# Patient Record
Sex: Female | Born: 1935 | Race: White | Hispanic: No | Marital: Married | Smoking: Never smoker
Health system: Southern US, Community
[De-identification: ages and names within clinical notes are randomized; demographics above are authoritative.]

## PROBLEM LIST (undated history)

## (undated) DIAGNOSIS — R911 Solitary pulmonary nodule: Secondary | ICD-10-CM

## (undated) DIAGNOSIS — R0602 Shortness of breath: Secondary | ICD-10-CM

## (undated) DIAGNOSIS — E785 Hyperlipidemia, unspecified: Secondary | ICD-10-CM

## (undated) DIAGNOSIS — I2510 Atherosclerotic heart disease of native coronary artery without angina pectoris: Secondary | ICD-10-CM

## (undated) DIAGNOSIS — C801 Malignant (primary) neoplasm, unspecified: Secondary | ICD-10-CM

## (undated) DIAGNOSIS — E039 Hypothyroidism, unspecified: Secondary | ICD-10-CM

## (undated) DIAGNOSIS — M1990 Unspecified osteoarthritis, unspecified site: Secondary | ICD-10-CM

## (undated) DIAGNOSIS — I10 Essential (primary) hypertension: Secondary | ICD-10-CM

## (undated) DIAGNOSIS — Z90710 Acquired absence of both cervix and uterus: Secondary | ICD-10-CM

## (undated) HISTORY — PX: TOTAL THYROIDECTOMY: SHX2547

## (undated) HISTORY — DX: Atherosclerotic heart disease of native coronary artery without angina pectoris: I25.10

## (undated) HISTORY — PX: EYE SURGERY: SHX253

## (undated) HISTORY — DX: Hyperlipidemia, unspecified: E78.5

## (undated) HISTORY — PX: BLADDER SURGERY: SHX569

## (undated) HISTORY — DX: Solitary pulmonary nodule: R91.1

## (undated) HISTORY — PX: RETINAL DETACHMENT SURGERY: SHX105

## (undated) HISTORY — DX: Essential (primary) hypertension: I10

## (undated) HISTORY — DX: Acquired absence of both cervix and uterus: Z90.710

## (undated) HISTORY — DX: Unspecified osteoarthritis, unspecified site: M19.90

## (undated) HISTORY — PX: ABDOMINAL HYSTERECTOMY: SHX81

## (undated) HISTORY — DX: Malignant (primary) neoplasm, unspecified: C80.1

---

## 2013-11-22 DIAGNOSIS — R911 Solitary pulmonary nodule: Secondary | ICD-10-CM

## 2013-11-22 DIAGNOSIS — C3410 Malignant neoplasm of upper lobe, unspecified bronchus or lung: Secondary | ICD-10-CM | POA: Insufficient documentation

## 2013-11-22 HISTORY — DX: Solitary pulmonary nodule: R91.1

## 2014-06-18 LAB — PULMONARY FUNCTION TEST

## 2014-07-04 ENCOUNTER — Encounter: Payer: Medicare PPO | Admitting: Cardiothoracic Surgery

## 2014-07-05 ENCOUNTER — Institutional Professional Consult (permissible substitution) (INDEPENDENT_AMBULATORY_CARE_PROVIDER_SITE_OTHER): Payer: 59 | Admitting: Cardiothoracic Surgery

## 2014-07-05 ENCOUNTER — Encounter: Payer: Self-pay | Admitting: Cardiothoracic Surgery

## 2014-07-05 ENCOUNTER — Encounter: Payer: Self-pay | Admitting: *Deleted

## 2014-07-05 VITALS — BP 150/96 | HR 76 | Ht 67.0 in | Wt 180.0 lb

## 2014-07-05 DIAGNOSIS — M069 Rheumatoid arthritis, unspecified: Secondary | ICD-10-CM | POA: Insufficient documentation

## 2014-07-05 DIAGNOSIS — I2584 Coronary atherosclerosis due to calcified coronary lesion: Secondary | ICD-10-CM

## 2014-07-05 DIAGNOSIS — M1990 Unspecified osteoarthritis, unspecified site: Secondary | ICD-10-CM | POA: Insufficient documentation

## 2014-07-05 DIAGNOSIS — Z85850 Personal history of malignant neoplasm of thyroid: Secondary | ICD-10-CM

## 2014-07-05 DIAGNOSIS — R9389 Abnormal findings on diagnostic imaging of other specified body structures: Secondary | ICD-10-CM | POA: Insufficient documentation

## 2014-07-05 DIAGNOSIS — E785 Hyperlipidemia, unspecified: Secondary | ICD-10-CM | POA: Insufficient documentation

## 2014-07-05 DIAGNOSIS — I2510 Atherosclerotic heart disease of native coronary artery without angina pectoris: Secondary | ICD-10-CM | POA: Insufficient documentation

## 2014-07-05 DIAGNOSIS — R911 Solitary pulmonary nodule: Secondary | ICD-10-CM

## 2014-07-05 DIAGNOSIS — I10 Essential (primary) hypertension: Secondary | ICD-10-CM | POA: Insufficient documentation

## 2014-07-05 DIAGNOSIS — J984 Other disorders of lung: Secondary | ICD-10-CM

## 2014-07-05 DIAGNOSIS — E039 Hypothyroidism, unspecified: Secondary | ICD-10-CM | POA: Insufficient documentation

## 2014-07-05 NOTE — Patient Instructions (Signed)
Lung Resection A lung resection is a procedure to remove part or all of a lung. When an entire lung is removed, the procedure is called a pneumonectomy. When only part of a lung is removed, the procedure is called a lobectomy. A lung resection is typically done to get rid of a tumor or cancer, but it may be done to treat other conditions. This procedure can help relieve some or all of your symptoms and can also help keep the problem from getting worse. Lung resection may provide the best chance for curing your disease. However, the procedure may not necessarily cure lung cancer if that is the problem.  LET YOUR HEALTH CARE PROVIDER KNOW ABOUT:   Any allergies you have.  All medicines you are taking, including vitamins, herbs, eye drops, creams, and over-the-counter medicines.  Previous problems you or members of your family have had with the use of anesthetics.  Any blood disorders you have.  Previous surgeries you have had.  Medical conditions you have. RISKS AND COMPLICATIONS  Generally, lung resection is a safe procedure. However, problems can occur and include:  Excessive bleeding.  Infection.  Inability to breathe without a ventilator.  Persistent shortness of breath.  Heart problems, including abnormal rhythms and a risk of heart attack or heart failure.  Blood clots.  Injury to a blood vessel.  Injury to a nerve.  Failure to heal properly.  Stroke.  Bronchopleural fistula. This is a small hole between one of the main breathing tubes (bronchus) and the lining of the lungs. This is rare.  Reaction to anesthesia. BEFORE THE PROCEDURE  You may have tests done before the procedure, including:  Blood tests.  Urine tests.  X-rays.  Other imaging tests (such as CT scans, MRI scans, and PET scans). These tests are done to find the exact size and location of the condition being treated with this surgery.  Pulmonary function tests. These are breathing tests to assess  the function of your lungs before surgery and to decide how to best help your breathing after surgery.  Heart testing. This is done to make sure your heart is strong enough for the procedure.  Bronchoscopy. This is a technique that allows your health care provider to look at the inside of your airways. This is done using a soft, flexible tube (bronchoscope). Along with imaging tests, this can help your health care provider know the exact location and size of the area that will be removed during surgery.  Lymph node sampling. This may need to be done to see if the tumor has spread. It may be done as a separate surgery or right before your lung resection procedure. PROCEDURE  An IV tube will be placed in your arm. You will be given a medicine that makes you fall asleep (general anesthetic). You may also get pain medicine through a thin, flexible tube (catheter) in your back.  A breathing tube will be placed in your throat.  Once the surgical team has prepared you for surgery, your surgeon will make an incision on your side. Some resections are done through large incisions, while others can be done through small incisions using smaller instruments and assisted with small cameras (laparoscopic surgery).  Your surgeon will carefully cut the veins, arteries, and bronchus leading to your lung. After being cut, each of these pieces will be sewn or stapled closed. The lung or part of the lung will then be removed.  Your surgeon will check inside your chest to make   sure there is no bleeding in or around the lungs. Lymph nodes near the lung may also be removed for later tests.  Your surgeon may put tubes into your chest to drain extra fluid and air after surgery.  Your incision will be closed. This may be done using:  Stitches that absorb into your body and do not need to be removed.  Stitches that must be removed.  Staples that must be removed. AFTER THE PROCEDURE   You will be taken to the  recovery area and your progress will be monitored. You may still have a breathing tube and other tubes or catheters in your body immediately after surgery. These will be removed during your recovery. You may be put on a respirator following surgery if some assistance is needed to help your breathing. When you are awake and not experiencing immediate problems from surgery, you will be moved to the intensive care unit (ICU) where you will continue your recovery.  You may feel pain in your chest and throat. Sometimes during recovery, patients may shiver or feel nauseous. You will be given medicine to help with pain and nausea.  The breathing tube will be taken out as soon as your health care providers feel you can breathe on your own. For most people, this happens on the same day as the surgery.  If your surgery and time in the ICU go well, most of the tubes and equipment will be taken out within 1-2 days after surgery. This is about how long most people stay in the ICU. You may need to stay longer, depending on how you are doing.  You should also start respiratory therapy in the ICU. This therapy uses breathing exercises to help your other lung stay healthy and get stronger.  As you improve, you will be moved to a regular hospital room for continued respiratory therapy, help with your bladder and bowels, and to continue medicines.  After your lung or part of your lung is taken out, there will be a space inside your chest. This space will often fill up with fluid over time. The amount of time this takes is different for each person.  You will receive care until you are doing well and your health care provider feels it is safe for you to go home or to transfer to an extended care facility. Document Released: 01/30/2003 Document Revised: 03/26/2014 Document Reviewed: 12/29/2013 [facility] Patient Information 2015 [place], [place]. This information is not intended to replace advice given to you by your health  care provider. Make sure you discuss any questions you have with your health care provider. Lung Resection, Care After Refer to this sheet in the next few weeks. These instructions provide you with information on caring for yourself after your procedure. Your health care provider may also give you more specific instructions. Your treatment has been planned according to current medical practices, but problems sometimes occur. Call your health care provider if you have any problems or questions after your procedure. WHAT TO EXPECT AFTER THE PROCEDURE After your procedure, it is typical to have the following:   You may feel pain in your chest and throat.  Patients may sometimes shiver or feel nauseous during recovery. HOME CARE INSTRUCTIONS  You may resume a normal diet and activities as directed by your health care provider.  Do not use any tobacco products, including cigarettes, chewing tobacco, or electronic cigarettes. If you need help quitting, ask your health care provider.  There are many different  ways to close and cover an incision, including stitches, skin glue, and adhesive strips. Follow your health care provider's instructions on:  Incision care.  Bandage (dressing) changes and removal.  Incision closure removal.  Take medicines only as directed by your health care provider.  Keep all follow-up visits as directed by your health care provider. This is important.  Try to breathe deeply and cough as directed. Holding a pillow firmly over your ribs may help with discomfort.  If you were given an incentive spirometer in the hospital, continue to use it as directed by your health care provider.  Walk as directed by your health care provider.  You may take a shower and gently wash the area of your incision with water and soap as directed by your health care provider. Do not use anything else to clean your incision except as directed by your health care provider. Do not take baths,  swim, or use a hot tub until your health care provider approves. SEEK MEDICAL CARE IF:  You notice redness, swelling, or increasing pain at the incision site.  You are bleeding at the incision site.  You see pus coming from the incision site.  You notice a bad smell coming from the incision site or bandage.  Your incision breaks open.  You cough up blood or pus, or you develop a cough that produces bad-smelling sputum.  You have pain or swelling in your legs.  You have increasing pain that is not controlled with medicine.  You have trouble managing any of the tubes that have been left in place after surgery.  You have fever or chills. SEEK IMMEDIATE MEDICAL CARE IF:   You have chest pain or an irregular or rapid heartbeat.  You have dizzy episodes or faint.  You have shortness of breath or difficulty breathing.  You have persistent nausea or vomiting.  You have a rash. Document Released: 05/29/2005 Document Revised: 03/26/2014 Document Reviewed: 12/29/2013 [facility] Patient Information 2015 [place], [place]. This information is not intended to replace advice given to you by your health care provider. Make sure you discuss any questions you have with your health care provider. Lung Cancer Lung cancer is an abnormal growth of cells in one or both of your lungs. These extra cells may form a mass of tissue called a growth or tumor. Tumors can be either cancerous (malignant) or not cancerous (benign).  Lung cancer is the most common cause of cancer death in men and women. There are several different types of lung cancers. Usually, lung cancer is described as either small cell lung cancer or nonsmall cell lung cancer. Other types of cancer occur in the lungs, including carcinoid and cancers spread from other organs. The types of cancer have different behavior and treatment. RISK FACTORS Smoking is the most common risk factor for developing lung cancer. Other risk factors  include:  Radon gas exposure.  Asbestos and other industrial substance exposure.  Second hand tobacco smoke.  Air pollution.  Family or personal history of lung cancer.  Age older than 69 years. CAUSES  Lung cancer usually starts when the lungs are exposed to harmful chemicals. Smoking is the most common risk factor for lung cancer. When you quit smoking, your risk of lung cancer falls each year (but is never the same as a person who has never smoked).  SYMPTOMS  Lung cancer may not have any symptoms in its early stages. The symptoms can depend on the type of cancer, its location, and other  factors. Symptoms can include:  Cough (either new, different, or more severe).  Shortness of breath.  Coughing up blood (hemoptysis).  Chest pain.  Hoarseness.  Swelling of the face.  Drooping eyelid.  Changes in blood tests, such as low sodium (hyponatremia), high calcium (hypercalcemia), or low blood count (anemia).  Weight loss. DIAGNOSIS  Your health care provider may suspect lung cancer based on your symptoms or based on tests obtained for other reasons. Tests or procedures used to find or confirm the presence of lung cancer may include:  Chest X-ray.  CT scan of the lungs and chest.  Blood tests.  Taking a tissue sample (biopsy) from your lung to look for cancer cells. Your cancer will be staged to determine its severity and extent. Staging is a careful attempt to find out the size of the tumor, whether the cancer has spread, and if so, to what parts of the body. You may need to have more tests to determine the stage of your cancer. The test results will help determine what treatment plan is best for you.   Stage 0--This is the earliest stage of lung cancer. In this stage the tumor is present in only a few layers of cells and has not grown beyond the inner lining of the lungs. Stage 0 (carcinoma in situ) is considered noninvasive, meaning at this stage it is not yet capable of  spreading to other regions.  Stage I-- The cancer is located only in the lungs and not spread to any lymph nodes.  Stage II--The cancer is in the lungs and the nearby lymph nodes.  Stage III--The cancer is in the lungs and the lymph nodes in the middle of the chest. This is also called locally advanced disease. This stage has two subtypes:  Stage IIIa - The cancer has spread only to lymph nodes on the same side of the chest where the cancer started.  Stage IIIb - The cancer has spread to lymph nodes on the opposite side of the chest or above the collar bone.  Stage IV-- This is the most advanced stage of lung cancer and is also called advanced disease. This stage describes when the cancer has spread to both lungs, the fluid in the area around the lungs, or to another body part. Your health care provider may tell you the detailed stage of your cancer, which includes both a number and a letter.  TREATMENT  Depending on the type and stage, lung cancer may be treated with surgery, radiation therapy, chemotherapy, or targeted therapy. Some people have a combination of these therapies. Your treatment plan will be developed by your health care team.  [place]st not smoke.  Only take over-the-counter or prescription medicines for pain, discomfort, or fever as directed by your health care provider.  Maintain a healthy diet.  Consider joining a support group. This may help you learn to cope with the stress of having lung cancer.  Seek advice to help you manage treatment side effects.  Keep all follow-up appointments as directed by your health care provider.  Inform your cancer specialist if you are admitted to the hospital. [place] IF:   You are losing weight without trying.  You have a persistent cough.  You feel short of breath.  You tire easily. SEEK IMMEDIATE MEDICAL CARE IF:   You cough up clotted blood or bright red blood.  Your pain is not  manageable or controlled by medicine.  You develop  new difficulty breathing or chest pain.  You develop swelling in one or both ankles or legs, or swelling in your face or neck.  You develop headache or confusion. Document Released: 02/15/2001 Document Revised: 08/30/2013 Document Reviewed: 03/15/2014 [facility] Patient Information 2015 [place], [place]. This information is not intended to replace advice given to you by your health care provider. Make sure you discuss any questions you have with your health care provider.

## 2014-07-05 NOTE — Progress Notes (Signed)
[address]       [place] [address]             [phone]                    Nicole Ortiz Nelson Medical Record #[id number] Date of Birth: 11/14/1936  Referring: Gardiner Rhyme, MD Primary Care: Alma Friendly, MD  Chief Complaint:  Abnormal CT scan  History of Present Illness:    Nicole Ortiz 78 y.o. female is seen in the office  today at the request of Dr. Alcide Clever. Last year the patient had a thyroid border and cyst she had a thyroidectomy at that time. Subsequently she was treated with I-131 for thyroid cancer,Sclerosing papillary thyroid carcinoma, follicular variant, multifocal. CT scan in December 2014 suggested lung mass, followup CT scan and PET scan was done recently and the patient is referred for evaluation of lung mass, described as a groundglass opacity in the left lung apex 2.1 cm with an SUV of 4.0. The patient is a lifelong nonsmoker. She denies any respiratory difficulty denies hemoptysis.    Current Activity/ Functional Status:  Patient is independent with mobility/ambulation, transfers, ADL's, IADL's.   Zubrod Score: At the time of surgery this patient's most appropriate activity status/level should be described as: [x]     0    Normal activity, no symptoms []     1    Restricted in physical strenuous activity but ambulatory, able to do out light work []     2    Ambulatory and capable of self care, unable to do work activities, up and about               >50 % of waking hours                              []     3    Only limited self care, in bed greater than 50% of waking hours []     4    Completely disabled, no self care, confined to bed or chair []     5    Moribund   Past Medical History  Diagnosis Date  . Hypertension   . Hyperlipidemia   . Arthritis   . CAD (coronary artery disease)     with stent placement  . H/O: hysterectomy   . Lesion of left lung 11/22/13    left upper lobe  . Cancer     thyroid    Past Surgical  History  Procedure Laterality Date  . Total thyroidectomy    . Eye surgery    . Retinal detachment surgery    . Bladder surgery      Family History: Father died mi 3 brothers alive, married 2 children   History   Social History  . Marital Status: Married    Spouse Name: N/A    Number of Children: N/A  . Years of Education: N/A   Occupational History  . Worked as Art gallery manager   Social History Main Topics  . Smoking status: Never Smoker   . Smokeless tobacco: Not on file  . Alcohol Use: No  . Drug Use: Not on file  . Sexual Activity: Not on file      History  Smoking status  . Never Smoker   Smokeless tobacco  . Not on file    History  Alcohol Use No     Allergies  Allergen Reactions  . Chlorthalidone   . Lipitor [Atorvastatin]   . Zocor [Simvastatin]     Current Outpatient Prescriptions  Medication Sig Dispense Refill  . amLODipine (NORVASC) 5 MG tablet Take 5 mg by mouth daily.      Marland Kitchen aspirin EC 81 MG tablet Take 81 mg by mouth daily.      . calcium-vitamin D (OSCAL WITH D) 500-200 MG-UNIT per tablet Take 1 tablet by mouth daily with breakfast.      . colesevelam (WELCHOL) 625 MG tablet Take by mouth 4 (four) times daily.      . diazepam (VALIUM) 5 MG tablet Take 5 mg by mouth daily.      . furosemide (LASIX) 20 MG tablet Take 20 mg by mouth daily.      Marland Kitchen levothyroxine (SYNTHROID, LEVOTHROID) 137 MCG tablet Take 137 mcg by mouth daily before breakfast.      . Omega-3 Fatty Acids (FISH OIL) 1000 MG CAPS Take 1 capsule by mouth daily.      Marland Kitchen telmisartan-hydrochlorothiazide (MICARDIS HCT) 80-25 MG per tablet Take 1 tablet by mouth daily.       No current facility-administered medications for this visit.     Review of Systems:     Cardiac Review of Systems: Y or N  Chest Pain [ n   ]  Resting SOB [n   ] Exertional SOB  [n  ]  Orthopnea [ n ]   Pedal Edema [  n ]    Palpitations [n ] Syncope  [ n ]   Presyncope [  n]  General Review of Systems: [Y]  = yes [  ]=no Constitional: recent weight change [ n ];  Wt loss over the last 3 months [   ] anorexia [  ]; fatigue [  ]; nausea [  ]; night sweats [  ]; fever [  ]; or chills [  ];          Dental: poor dentition[ n ]; Last Dentist visit:   Eye : blurred vision [  ]; diplopia [   ]; vision changes [  ];  Amaurosis fugax[  ]; Resp: cough [  ];  wheezing[n  ];  hemoptysis[n  ]; shortness of breath[ n ]; paroxysmal nocturnal dyspnea[ n ]; dyspnea on exertion[  ]; or orthopnea[  ];  GI:  gallstones[  ], vomiting[  ];  dysphagia[  ]; melena[  ];  hematochezia [  ]; heartburn[  ];   Hx of  Colonoscopy[  ]; GU: kidney stones [  ]; hematuria[  ];   dysuria [  ];  nocturia[  ];  history of     obstruction [  ]; urinary frequency [  ]             Skin: rash, swelling[  ];, hair loss[  ];  peripheral edema[  ];  or itching[  ]; Musculosketetal: myalgias[  ];  joint swelling[  ];  joint erythema[  ];  joint pain[  ];  back pain[  ];  Heme/Lymph: bruising[  ];  bleeding[  ];  anemia[  ];  Neuro: TIA[n  ];  headaches[  ];  stroke[ n ];  vertigo[  ];  seizures[  ];   paresthesias[  ];  difficulty walking[  ];  Psych:depression[  ]; anxiety[  ];  Endocrine: diabetes[  ];  thyroid dysfunction[  ];  Immunizations: Flu up to date [ ? ]; Pneumococcal up to date [?  ];  Other:  Physical Exam: BP 150/96  Pulse 76  Ht 5\' 7"  (1.702 m)  Wt 180 lb (81.647 kg)  BMI 28.19 kg/m2  SpO2 96%  PHYSICAL EXAMINATION:  General appearance: alert, cooperative and appears stated age Neurologic: intact Heart: regular rate and rhythm, S1, S2 normal, no murmur, click, rub or gallop Lungs: clear to auscultation bilaterally Abdomen: soft, non-tender; bowel sounds normal; no masses,  no organomegaly Extremities: extremities normal, atraumatic, no cyanosis or edema and Homans sign is negative, no sign of DVT Patient has no carotid bruits, well-healed thyroidectomy scar.  Shows full DP and PT pulses bilaterally  Diagnostic  Studies & Laboratory data:     Recent Radiology Findings:  IMPRESSION: Well-circumscribed groundglass opacity. In the left lung apex has increased in size. Infection than neoplasm such as BAC are probably highest on the long list of differential possibilities.   Result Narrative  TECHNIQUE: CT chest without contrast. Comparison:  11/22/2013.  FINDINGS: Well-circumscribed area of groundglass opacity in the posterior left apex is larger measuring 26 mm on image #20, previously 19 mm. Mild scarring in both lung apices is stable. No other infiltrates or effusions are present. Lower lobes show  early bronchiectasis and peripheral fibrosis. Thoracic lymph nodes are normal in size. Major airways are patent. Coronary arteries are heavily atherosclerotic. Heart size is normal. No acute findings in the visible abdominal organs.   CLINICAL DATA: Initial Treatment strategy for lung nodule. History of thyroid cancer.Marland Kitchen  EXAM: NUCLEAR MEDICINE PET SKULL BASE TO THIGH  TECHNIQUE: Twenty mCi F-18 FDG was injected intravenously. Full-ring PET imaging was performed from the skull base to thigh after the radiotracer. CT data was obtained and used for attenuation correction and anatomic localization.  FASTING BLOOD GLUCOSE: Value: 90 mg/dl  COMPARISON: Outside images dated 06/07/2014  FINDINGS: NECK  No hypermetabolic lymph nodes in the neck. Thyroid gland absent.  CHEST  Apical segment ground-glass density left upper lobe nodule, 2.1 cm in diameter, maximum standard uptake value 4.0. Coronary stent noted.  ABDOMEN/PELVIS  No abnormal hypermetabolic activity within the liver, pancreas, adrenal glands, or spleen. No hypermetabolic lymph nodes in the abdomen or pelvis. Bilateral renal cysts include a 10 mm complex cyst of the left kidney lower pole which does not appear hypermetabolic and of 11 mm cyst of the right kidney upper pole which does not appear hypermetabolic. Aortoiliac  atherosclerotic vascular disease.  SKELETON  No focal hypermetabolic activity to suggest skeletal metastasis. Faintly increased activity associated with left eccentric facet arthropathy at 1 location in the cervical spine and several locations in the lower lumbar spine.  IMPRESSION: 1. Ground-glass density 2.1 cm apical segment left upper lobe nodule is hypermetabolic, with maximum SUV of 4.0. This would be a very unusual appearance for metastatic thyroid cancer and is more likely to represent low grade lung adenocarcinoma. No associated adenopathy. 2. Atherosclerosis. 3. Bilateral renal cysts, including several small complex renal cysts which do not have obvious associated increased metabolic activity and are accordingly probably benign.   Electronically Signed By: Sherryl Barters M.D. On: 06/21/2014 11:54     Recent Lab Findings: No results found for this basename: WBC, HGB, HCT, PLT, GLUCOSE, CHOL, TRIG, HDL, LDLDIRECT, LDLCALC, ALT, AST, NA, K, CL, CREATININE, BUN, CO2, TSH, INR, GLUF, HGBA1C   PATH: 07-03-2013  Diagnosis   Thyroid, total resection:  Sclerosing papillary thyroid carcinoma, follicular variant,  multifocal.  Nodular hyperplasia with cystic change, Hurthle cell metaplasia, and  dystrophic calcification.  Hashimoto's thyroiditis.  Benign-appearing parathyroid  tissue.  See template and comment below.  (esn[mns])    TEMPLATE FOR THYROID CARCINOMA   PROCEDURE/SPECIMEN TYPE: Total thyroidectomy. SPECIMEN INTEGRITY: Intact. SPECIMEN SIZE: 29.7 gm, 5.2 x 3.6 x 2.0 cm right  lobe, 4.4 x 1.9 x 1.8 cm left lobe,  and 3.0 x 0.9 x 0.4 cm isthmus. LOCATION: Left lobe and isthmus. FOCALITY: Multifocal. HISTOPATHOLOGIC TYPE: Papillary thyroid carcinoma. SIZE: 0.8 cm. GRADE: G1 (well differentiated). TUMOR ENCAPSULATION: No. TUMOR CAPSULE INVASION: Not identified. ANGIOLYMPHATIC INVASION: Not identified. RESECTION MARGINS: Negative. EXTRATHYROIDAL  EXTENSION: Not identified. TOTAL NODES EXAMINED: 0. NODES CONTAINING METASTASES: Not applicable. SPECIAL STUDIES: Not performed. TNM STAGE: T1a NX M0. AJCC STAGE GROUPING: I.  Comment  There are at least three foci of papillary thyroid carcinoma present in  the left lobe (up to 0.8 cm) and one focus within the isthmus (0.3 cm).    Gayla Doss MD  Pathologist   PFT's  FEV1 1.89  89%  No diffusion Stree test 12/2013  [facility] cardiology- Dr Otho Perl negative with limited exercise   Assessment / Plan:   Patient with history of thyroid cancer and suspicious left upper lobe lung lesion CT and PET scan. I recommended to the patient that we proceed with resection. She will obtain cardiology clearance from Dr. Otho Perl who is seen her in the past and did a stress test in February 2005.  The patient has a family vacation camping trip planned and will return to the office on her return to schedule surgical procedure/      I spent 55 minutes counseling the patient face to face. The total time spent in the appointment was 80 minutes.  Grace Isaac MD      [address] [place] [address] Office [phone]   Beeper [phone]  07/05/2014 3:08 PM

## 2014-07-09 ENCOUNTER — Encounter: Payer: Self-pay | Admitting: Cardiothoracic Surgery

## 2014-07-09 DIAGNOSIS — Z85850 Personal history of malignant neoplasm of thyroid: Secondary | ICD-10-CM | POA: Insufficient documentation

## 2014-07-12 ENCOUNTER — Other Ambulatory Visit: Payer: Self-pay

## 2014-07-12 DIAGNOSIS — R911 Solitary pulmonary nodule: Secondary | ICD-10-CM

## 2014-07-19 ENCOUNTER — Inpatient Hospital Stay
Admission: RE | Admit: 2014-07-19 | Discharge: 2014-07-19 | Disposition: A | Discharge status: Home / Self Care | Payer: Self-pay | Source: Ambulatory Visit | Attending: Cardiothoracic Surgery | Admitting: Cardiothoracic Surgery

## 2014-07-19 ENCOUNTER — Other Ambulatory Visit: Payer: Self-pay | Admitting: Cardiothoracic Surgery

## 2014-07-19 DIAGNOSIS — R52 Pain, unspecified: Secondary | ICD-10-CM

## 2014-08-01 ENCOUNTER — Encounter (HOSPITAL_COMMUNITY): Payer: Self-pay

## 2014-08-03 NOTE — Pre-Procedure Instructions (Signed)
Nicole Ortiz  08/03/2014   Your procedure is scheduled on: Wednesday, Sept. 16, 2015  Report to [facility] Admitting at 6:30 AM.  Call this number if you have problems the morning of surgery: [phone]   Remember:   Do not eat food or drink liquids after midnight.   Take these medicines the morning of surgery with A SIP OF WATER: amLODipine (NORVASC l evothyroxine (SYNTHROID) Stop taking Aspirin, vitamins, and herbal medications ( Omega-3 Fatty Acids (FISH OIL).  Do not take any NSAIDs ie: Ibuprofen, Advil, Naproxen or any medication containing Aspirin; stop now.   Do not wear jewelry, make-up or nail polish.  Do not wear lotions, powders, or perfumes. You may not wear deodorant.  Do not shave 48 hours prior to surgery.  Do not bring valuables to the hospital.  [facility] is not responsible for any belongings or valuables.               Contacts, dentures or bridgework may not be worn into surgery.  Leave suitcase in the car. After surgery it may be brought to your room.  For patients admitted to the hospital, discharge time is determined by your treatment team.               Patients discharged the day of surgery will not be allowed to drive home.  Name and phone number of your driver:   Special Instructions:  Special Instructions:Special Instructions: [facility] - Preparing for Surgery  Before surgery, you can play an important role.  Because skin is not sterile, your skin needs to be as free of germs as possible.  You can reduce the number of germs on you skin by washing with CHG (chlorahexidine gluconate) soap before surgery.  CHG is an antiseptic cleaner which kills germs and bonds with the skin to continue killing germs even after washing.  Please DO NOT use if you have an allergy to CHG or antibacterial soaps.  If your skin becomes reddened/irritated stop using the CHG and inform your nurse when you arrive at Short Stay.  Do not shave (including legs and  underarms) for at least 48 hours prior to the first CHG shower.  You may shave your face.  Please follow these instructions carefully:   1.  Shower with CHG Soap the night before surgery and the morning of Surgery.  2.  If you choose to wash your hair, wash your hair first as usual with your normal shampoo.  3.  After you shampoo, rinse your hair and body thoroughly to remove the Shampoo.  4.  Use CHG as you would any other liquid soap.  You can apply chg directly  to the skin and wash gently with scrungie or a clean washcloth.  5.  Apply the CHG Soap to your body ONLY FROM THE NECK DOWN.  Do not use on open wounds or open sores.  Avoid contact with your eyes, ears, mouth and genitals (private parts).  Wash genitals (private parts) with your normal soap.  6.  Wash thoroughly, paying special attention to the area where your surgery will be performed.  7.  Thoroughly rinse your body with warm water from the neck down.  8.  DO NOT shower/wash with your normal soap after using and rinsing off the CHG Soap.  9.  Pat yourself dry with a clean towel.            10.  Wear clean pajamas.  11.  Place clean sheets on your bed the night of your first shower and do not sleep with pets.  Day of Surgery  Do not apply any lotions/deodorants the morning of surgery.  Please wear clean clothes to the hospital/surgery center.   Please read over the following fact sheets that you were given: Pain Booklet, Coughing and Deep Breathing, Blood Transfusion Information, MRSA Information and Surgical Site Infection Prevention

## 2014-08-06 ENCOUNTER — Encounter (HOSPITAL_COMMUNITY): Payer: Self-pay

## 2014-08-06 ENCOUNTER — Ambulatory Visit (HOSPITAL_COMMUNITY)
Admission: RE | Admit: 2014-08-06 | Discharge: 2014-08-06 | Disposition: A | Discharge status: Home / Self Care | Payer: Medicare PPO | Source: Ambulatory Visit | Attending: Cardiothoracic Surgery | Admitting: Cardiothoracic Surgery

## 2014-08-06 ENCOUNTER — Encounter (HOSPITAL_COMMUNITY)
Admission: RE | Admit: 2014-08-06 | Discharge: 2014-08-06 | Disposition: A | Discharge status: Home / Self Care | Payer: Medicare PPO | Source: Ambulatory Visit | Attending: Cardiothoracic Surgery | Admitting: Cardiothoracic Surgery

## 2014-08-06 VITALS — BP 146/82 | HR 81 | Temp 98.5°F | Resp 20 | Ht 67.0 in | Wt 182.5 lb

## 2014-08-06 DIAGNOSIS — E038 Other specified hypothyroidism: Secondary | ICD-10-CM | POA: Diagnosis not present

## 2014-08-06 DIAGNOSIS — Z01818 Encounter for other preprocedural examination: Secondary | ICD-10-CM | POA: Diagnosis present

## 2014-08-06 DIAGNOSIS — E785 Hyperlipidemia, unspecified: Secondary | ICD-10-CM | POA: Diagnosis not present

## 2014-08-06 DIAGNOSIS — R9431 Abnormal electrocardiogram [ECG] [EKG]: Secondary | ICD-10-CM | POA: Insufficient documentation

## 2014-08-06 DIAGNOSIS — J449 Chronic obstructive pulmonary disease, unspecified: Secondary | ICD-10-CM | POA: Diagnosis not present

## 2014-08-06 DIAGNOSIS — I10 Essential (primary) hypertension: Secondary | ICD-10-CM | POA: Diagnosis not present

## 2014-08-06 DIAGNOSIS — J4489 Other specified chronic obstructive pulmonary disease: Secondary | ICD-10-CM | POA: Insufficient documentation

## 2014-08-06 DIAGNOSIS — I2510 Atherosclerotic heart disease of native coronary artery without angina pectoris: Secondary | ICD-10-CM | POA: Insufficient documentation

## 2014-08-06 DIAGNOSIS — R911 Solitary pulmonary nodule: Secondary | ICD-10-CM

## 2014-08-06 HISTORY — DX: Shortness of breath: R06.02

## 2014-08-06 HISTORY — DX: Hypothyroidism, unspecified: E03.9

## 2014-08-06 LAB — PULMONARY FUNCTION TEST
DL/VA % pred: 75 %
DL/VA: 3.89 ml/min/mmHg/L
DLCO cor % pred: 58 %
DLCO cor: 16.69 ml/min/mmHg
DLCO unc % pred: 58 %
DLCO unc: 16.54 ml/min/mmHg
FEF 25-75 Post: 1.52 L/sec
FEF 25-75 Pre: 1.71 L/sec
FEF2575-%Change-Post: -11 %
FEF2575-%Pred-Post: 86 %
FEF2575-%Pred-Pre: 97 %
FEV1-%Change-Post: -10 %
FEV1-%Pred-Post: 75 %
FEV1-%Pred-Pre: 85 %
FEV1-Post: 1.78 L
FEV1-Pre: 2 L
FEV1FVC-%Change-Post: -10 %
FEV1FVC-%Pred-Pre: 96 %
FEV6-%Change-Post: 0 %
FEV6-%Pred-Post: 91 %
FEV6-%Pred-Pre: 92 %
FEV6-Post: 2.74 L
FEV6-Pre: 2.76 L
FEV6FVC-%Pred-Post: 105 %
FEV6FVC-%Pred-Pre: 105 %
FVC-%Change-Post: 0 %
FVC-%Pred-Post: 87 %
FVC-%Pred-Pre: 88 %
FVC-Post: 2.74 L
FVC-Pre: 2.76 L
Post FEV1/FVC ratio: 65 %
Post FEV6/FVC ratio: 100 %
Pre FEV1/FVC ratio: 73 %
Pre FEV6/FVC Ratio: 100 %
RV % pred: 108 %
RV: 2.68 L
TLC % pred: 101 %
TLC: 5.61 L

## 2014-08-06 LAB — URINALYSIS, ROUTINE W REFLEX MICROSCOPIC
Bilirubin Urine: NEGATIVE
Glucose, UA: NEGATIVE mg/dL
Hgb urine dipstick: NEGATIVE
Ketones, ur: NEGATIVE mg/dL
Nitrite: NEGATIVE
Protein, ur: NEGATIVE mg/dL
Specific Gravity, Urine: 1.014 (ref 1.005–1.030)
Urobilinogen, UA: 0.2 mg/dL (ref 0.0–1.0)
pH: 6.5 (ref 5.0–8.0)

## 2014-08-06 LAB — BLOOD GAS, ARTERIAL
Acid-Base Excess: 2 mmol/L (ref 0.0–2.0)
Bicarbonate: 25.8 mEq/L — ABNORMAL HIGH (ref 20.0–24.0)
Drawn by: 206361
FIO2: 0.21 %
O2 Saturation: 96.9 %
Patient temperature: 98.6
TCO2: 27 mmol/L (ref 0–100)
pCO2 arterial: 38.4 mmHg (ref 35.0–45.0)
pH, Arterial: 7.442 (ref 7.350–7.450)
pO2, Arterial: 87.3 mmHg (ref 80.0–100.0)

## 2014-08-06 LAB — CBC
HCT: 37.8 % (ref 36.0–46.0)
Hemoglobin: 12.9 g/dL (ref 12.0–15.0)
MCH: 29.9 pg (ref 26.0–34.0)
MCHC: 34.1 g/dL (ref 30.0–36.0)
MCV: 87.7 fL (ref 78.0–100.0)
Platelets: 231 10*3/uL (ref 150–400)
RBC: 4.31 MIL/uL (ref 3.87–5.11)
RDW: 14 % (ref 11.5–15.5)
WBC: 5.5 10*3/uL (ref 4.0–10.5)

## 2014-08-06 LAB — COMPREHENSIVE METABOLIC PANEL
ALT: 14 U/L (ref 0–35)
AST: 19 U/L (ref 0–37)
Albumin: 4.2 g/dL (ref 3.5–5.2)
Alkaline Phosphatase: 91 U/L (ref 39–117)
Anion gap: 16 — ABNORMAL HIGH (ref 5–15)
BUN: 17 mg/dL (ref 6–23)
CO2: 22 mEq/L (ref 19–32)
Calcium: 9.9 mg/dL (ref 8.4–10.5)
Chloride: 100 mEq/L (ref 96–112)
Creatinine, Ser: 0.68 mg/dL (ref 0.50–1.10)
GFR calc Af Amer: >90 mL/min (ref >90)
GFR calc non Af Amer: 83 mL/min — ABNORMAL LOW (ref >90)
Glucose, Bld: 95 mg/dL (ref 70–99)
Potassium: 4.2 mEq/L (ref 3.7–5.3)
Sodium: 138 mEq/L (ref 137–147)
Total Bilirubin: 0.3 mg/dL (ref 0.3–1.2)
Total Protein: 8.5 g/dL — ABNORMAL HIGH (ref 6.0–8.3)

## 2014-08-06 LAB — URINE MICROSCOPIC-ADD ON

## 2014-08-06 LAB — TYPE AND SCREEN
ABO/RH(D): O POS
Antibody Screen: NEGATIVE

## 2014-08-06 LAB — PROTIME-INR
INR: 0.97 (ref 0.00–1.49)
Prothrombin Time: 12.9 seconds (ref 11.6–15.2)

## 2014-08-06 LAB — APTT: aPTT: 31 seconds (ref 24–37)

## 2014-08-06 LAB — SURGICAL PCR SCREEN
MRSA, PCR: NEGATIVE
Staphylococcus aureus: NEGATIVE

## 2014-08-06 LAB — ABO/RH: ABO/RH(D): O POS

## 2014-08-06 MED ORDER — ALBUTEROL SULFATE (2.5 MG/3ML) 0.083% IN NEBU
2.5000 mg | INHALATION_SOLUTION | Freq: Once | RESPIRATORY_TRACT | Status: AC
  Administered 2014-08-06: 2.5 mg via RESPIRATORY_TRACT

## 2014-08-07 ENCOUNTER — Encounter (HOSPITAL_COMMUNITY): Payer: Self-pay

## 2014-08-07 MED ORDER — DEXTROSE 5 % IV SOLN
1.5000 g | INTRAVENOUS | Status: AC
  Administered 2014-08-08: 1.5 g via INTRAVENOUS
  Filled 2014-08-07: qty 1.5

## 2014-08-07 NOTE — Progress Notes (Signed)
Anesthesia Chart Review:  Patient is a 78 year old female scheduled for bronchoscopy, left VATS with possible lung resection on 08/08/14 by Dr. Servando Snare.  History includes LUL lung nodule, non-smoker, HTN, HLD, thyroidectomy 8/11/4 (for cancer) with secondary hypothyroidism. CAD is listed but she denied history of CAD or stent (had aortoiliac atherosclerosis and coronary calcifications on CT), bilateral renal cyst on 05/2014 CT. Pulmonologist is Dr. Gardiner Rhyme.  PCP is Dr. Alma Friendly. She was seen by cardiologist Dr. Otho Perl with [facility] on 2014/08/08 for preoperative clearance and was felt to be acceptable risk (see Media tab).   Stree echo on 12/28/13 [facility], see Media tab) showed: Normal stress echo with no evidence of ischemia by echo or EKG. Very limited exercise capacity. Normal LV systolic function.   EKG on 08/06/14 showed: NSR, ST/T wave abnormality, consider anterior ischemia.  Anterior T wave abnormality is more pronounced when compared to 08-Aug-2014 EKG from Dr. Sudie Grumbling office.  PFT's 08/06/14 FVC 2.76 (88%, FEV1 2.00 (85%).   Preoperative CXR and labs noted.   Patient has been cleared by cardiology.  If no acute changes then I would anticipate that she could proceed as planned.  George Hugh [facility] Short Stay Center/Anesthesiology Phone [phone] 08/07/2014 10:46 AM

## 2014-08-08 ENCOUNTER — Encounter (HOSPITAL_COMMUNITY)
Admission: RE | Disposition: A | Discharge status: Home / Self Care | Payer: Self-pay | Source: Ambulatory Visit | Attending: Cardiothoracic Surgery

## 2014-08-08 ENCOUNTER — Encounter (HOSPITAL_COMMUNITY): Payer: Self-pay | Admitting: *Deleted

## 2014-08-08 ENCOUNTER — Encounter (HOSPITAL_COMMUNITY): Payer: Medicare PPO | Admitting: Vascular Surgery

## 2014-08-08 ENCOUNTER — Inpatient Hospital Stay (HOSPITAL_COMMUNITY): Payer: Medicare PPO

## 2014-08-08 ENCOUNTER — Inpatient Hospital Stay (HOSPITAL_COMMUNITY): Payer: Medicare PPO | Admitting: Anesthesiology

## 2014-08-08 ENCOUNTER — Inpatient Hospital Stay (HOSPITAL_COMMUNITY)
Admission: RE | Admit: 2014-08-08 | Discharge: 2014-08-14 | DRG: 164 | Disposition: A | Discharge status: Home / Self Care | Payer: Medicare PPO | Source: Ambulatory Visit | Attending: Cardiothoracic Surgery | Admitting: Cardiothoracic Surgery

## 2014-08-08 DIAGNOSIS — I2510 Atherosclerotic heart disease of native coronary artery without angina pectoris: Secondary | ICD-10-CM | POA: Diagnosis present

## 2014-08-08 DIAGNOSIS — I10 Essential (primary) hypertension: Secondary | ICD-10-CM | POA: Diagnosis present

## 2014-08-08 DIAGNOSIS — R911 Solitary pulmonary nodule: Secondary | ICD-10-CM

## 2014-08-08 DIAGNOSIS — Z888 Allergy status to other drugs, medicaments and biological substances status: Secondary | ICD-10-CM | POA: Diagnosis not present

## 2014-08-08 DIAGNOSIS — C3410 Malignant neoplasm of upper lobe, unspecified bronchus or lung: Secondary | ICD-10-CM | POA: Diagnosis present

## 2014-08-08 DIAGNOSIS — J984 Other disorders of lung: Secondary | ICD-10-CM

## 2014-08-08 DIAGNOSIS — D62 Acute posthemorrhagic anemia: Secondary | ICD-10-CM | POA: Diagnosis not present

## 2014-08-08 DIAGNOSIS — M129 Arthropathy, unspecified: Secondary | ICD-10-CM | POA: Diagnosis present

## 2014-08-08 DIAGNOSIS — J9383 Other pneumothorax: Secondary | ICD-10-CM | POA: Diagnosis not present

## 2014-08-08 DIAGNOSIS — E039 Hypothyroidism, unspecified: Secondary | ICD-10-CM | POA: Diagnosis present

## 2014-08-08 DIAGNOSIS — E785 Hyperlipidemia, unspecified: Secondary | ICD-10-CM | POA: Diagnosis present

## 2014-08-08 DIAGNOSIS — R222 Localized swelling, mass and lump, trunk: Secondary | ICD-10-CM | POA: Diagnosis present

## 2014-08-08 DIAGNOSIS — E063 Autoimmune thyroiditis: Secondary | ICD-10-CM | POA: Diagnosis present

## 2014-08-08 DIAGNOSIS — Z85850 Personal history of malignant neoplasm of thyroid: Secondary | ICD-10-CM | POA: Diagnosis not present

## 2014-08-08 DIAGNOSIS — Z9861 Coronary angioplasty status: Secondary | ICD-10-CM

## 2014-08-08 HISTORY — PX: LYMPH NODE BIOPSY: SHX201

## 2014-08-08 HISTORY — PX: VIDEO BRONCHOSCOPY: SHX5072

## 2014-08-08 HISTORY — PX: VIDEO ASSISTED THORACOSCOPY (VATS)/WEDGE RESECTION: SHX6174

## 2014-08-08 LAB — GLUCOSE, CAPILLARY
Glucose-Capillary: 162 mg/dL — ABNORMAL HIGH (ref 70–99)
Glucose-Capillary: 160 mg/dL — ABNORMAL HIGH (ref 70–99)

## 2014-08-08 SURGERY — BRONCHOSCOPY, VIDEO-ASSISTED
Anesthesia: General | Site: Chest

## 2014-08-08 MED ORDER — PROPOFOL 10 MG/ML IV BOLUS
INTRAVENOUS | Status: AC
  Filled 2014-08-08: qty 20

## 2014-08-08 MED ORDER — FENTANYL 10 MCG/ML IV SOLN
INTRAVENOUS | Status: DC
  Administered 2014-08-08: 20 ug via INTRAVENOUS
  Administered 2014-08-08 (×2): via INTRAVENOUS
  Administered 2014-08-08: 50 ug via INTRAVENOUS
  Administered 2014-08-09: 10 ug via INTRAVENOUS
  Administered 2014-08-09: 2 ug via INTRAVENOUS
  Administered 2014-08-10: 3 ug via INTRAVENOUS
  Administered 2014-08-10: 1 ug via INTRAVENOUS
  Filled 2014-08-08: qty 50

## 2014-08-08 MED ORDER — FENTANYL CITRATE 0.05 MG/ML IJ SOLN
INTRAMUSCULAR | Status: DC | PRN
  Administered 2014-08-08 (×2): 50 ug via INTRAVENOUS
  Administered 2014-08-08: 100 ug via INTRAVENOUS
  Administered 2014-08-08: 50 ug via INTRAVENOUS

## 2014-08-08 MED ORDER — ONDANSETRON HCL 4 MG/2ML IJ SOLN
INTRAMUSCULAR | Status: DC | PRN
  Administered 2014-08-08: 4 mg via INTRAVENOUS

## 2014-08-08 MED ORDER — COLESEVELAM HCL 625 MG PO TABS
1250.0000 mg | ORAL_TABLET | Freq: Two times a day (BID) | ORAL | Status: DC
  Administered 2014-08-08 – 2014-08-14 (×12): 1250 mg via ORAL
  Filled 2014-08-08 (×15): qty 2

## 2014-08-08 MED ORDER — PROMETHAZINE HCL 25 MG/ML IJ SOLN: 6.2500 mg | INTRAMUSCULAR | Status: DC | PRN

## 2014-08-08 MED ORDER — SUCCINYLCHOLINE CHLORIDE 20 MG/ML IJ SOLN
INTRAMUSCULAR | Status: AC
  Filled 2014-08-08: qty 1

## 2014-08-08 MED ORDER — DEXAMETHASONE SODIUM PHOSPHATE 4 MG/ML IJ SOLN
INTRAMUSCULAR | Status: DC | PRN
  Administered 2014-08-08: 4 mg via INTRAVENOUS

## 2014-08-08 MED ORDER — BUPIVACAINE HCL 0.5 % IJ SOLN
INTRAMUSCULAR | Status: DC | PRN
  Administered 2014-08-08: 10 mL

## 2014-08-08 MED ORDER — BUPIVACAINE 0.5 % ON-Q PUMP SINGLE CATH 400 ML
400.0000 mL | INJECTION | Status: AC
  Filled 2014-08-08: qty 400

## 2014-08-08 MED ORDER — SENNOSIDES-DOCUSATE SODIUM 8.6-50 MG PO TABS
1.0000 | ORAL_TABLET | Freq: Every day | ORAL | Status: DC
  Administered 2014-08-09 – 2014-08-10 (×2): 1 via ORAL
  Filled 2014-08-08 (×7): qty 1

## 2014-08-08 MED ORDER — ROCURONIUM BROMIDE 50 MG/5ML IV SOLN
INTRAVENOUS | Status: AC
  Filled 2014-08-08: qty 1

## 2014-08-08 MED ORDER — PROPOFOL 10 MG/ML IV BOLUS
INTRAVENOUS | Status: DC | PRN
  Administered 2014-08-08: 90 mg via INTRAVENOUS

## 2014-08-08 MED ORDER — GLYCOPYRROLATE 0.2 MG/ML IJ SOLN
INTRAMUSCULAR | Status: AC
  Filled 2014-08-08: qty 2

## 2014-08-08 MED ORDER — ACETAMINOPHEN 160 MG/5ML PO SOLN
1000.0000 mg | Freq: Four times a day (QID) | ORAL | Status: AC
  Filled 2014-08-08: qty 40

## 2014-08-08 MED ORDER — LEVOTHYROXINE SODIUM 150 MCG PO TABS
150.0000 ug | ORAL_TABLET | Freq: Every day | ORAL | Status: DC
  Administered 2014-08-09 – 2014-08-14 (×6): 150 ug via ORAL
  Filled 2014-08-08 (×7): qty 1

## 2014-08-08 MED ORDER — TELMISARTAN-HCTZ 80-25 MG PO TABS
1.0000 | ORAL_TABLET | Freq: Every day | ORAL | Status: DC
Start: 2014-08-09 — End: 2014-08-08

## 2014-08-08 MED ORDER — OMEGA-3-ACID ETHYL ESTERS 1 G PO CAPS
1.0000 g | ORAL_CAPSULE | Freq: Every day | ORAL | Status: DC
  Administered 2014-08-09 – 2014-08-14 (×6): 1 g via ORAL
  Filled 2014-08-08 (×6): qty 1

## 2014-08-08 MED ORDER — DIPHENHYDRAMINE HCL 12.5 MG/5ML PO ELIX
12.5000 mg | ORAL_SOLUTION | Freq: Four times a day (QID) | ORAL | Status: DC | PRN
  Filled 2014-08-08: qty 5

## 2014-08-08 MED ORDER — EPHEDRINE SULFATE 50 MG/ML IJ SOLN
INTRAMUSCULAR | Status: AC
  Filled 2014-08-08: qty 1

## 2014-08-08 MED ORDER — IRBESARTAN 300 MG PO TABS
300.0000 mg | ORAL_TABLET | Freq: Every day | ORAL | Status: DC
  Administered 2014-08-08 – 2014-08-14 (×7): 300 mg via ORAL
  Filled 2014-08-08 (×7): qty 1

## 2014-08-08 MED ORDER — 0.9 % SODIUM CHLORIDE (POUR BTL) OPTIME
TOPICAL | Status: DC | PRN
  Administered 2014-08-08: 1000 mL

## 2014-08-08 MED ORDER — LIDOCAINE HCL (CARDIAC) 20 MG/ML IV SOLN
INTRAVENOUS | Status: DC | PRN
  Administered 2014-08-08: 50 mg via INTRAVENOUS

## 2014-08-08 MED ORDER — OXYCODONE HCL 5 MG PO TABS
5.0000 mg | ORAL_TABLET | ORAL | Status: DC | PRN
  Administered 2014-08-08 – 2014-08-11 (×2): 10 mg via ORAL
  Filled 2014-08-08: qty 2

## 2014-08-08 MED ORDER — DEXTROSE 5 % IV SOLN
1.5000 g | Freq: Two times a day (BID) | INTRAVENOUS | Status: AC
  Administered 2014-08-08 – 2014-08-09 (×2): 1.5 g via INTRAVENOUS
  Filled 2014-08-08 (×2): qty 1.5

## 2014-08-08 MED ORDER — NALOXONE HCL 0.4 MG/ML IJ SOLN: 0.4000 mg | INTRAMUSCULAR | Status: DC | PRN

## 2014-08-08 MED ORDER — LACTATED RINGERS IV SOLN
INTRAVENOUS | Status: DC | PRN
  Administered 2014-08-08 (×2): via INTRAVENOUS

## 2014-08-08 MED ORDER — BISACODYL 5 MG PO TBEC
10.0000 mg | DELAYED_RELEASE_TABLET | Freq: Every day | ORAL | Status: DC
  Administered 2014-08-09 – 2014-08-11 (×3): 10 mg via ORAL
  Filled 2014-08-08 (×2): qty 2

## 2014-08-08 MED ORDER — HEMOSTATIC AGENTS (NO CHARGE) OPTIME
TOPICAL | Status: DC | PRN
  Administered 2014-08-08: 1 via TOPICAL

## 2014-08-08 MED ORDER — AMLODIPINE BESYLATE 5 MG PO TABS
5.0000 mg | ORAL_TABLET | Freq: Every day | ORAL | Status: DC
  Administered 2014-08-09 – 2014-08-14 (×6): 5 mg via ORAL
  Filled 2014-08-08 (×6): qty 1

## 2014-08-08 MED ORDER — TRAMADOL HCL 50 MG PO TABS
50.0000 mg | ORAL_TABLET | Freq: Four times a day (QID) | ORAL | Status: DC | PRN
  Administered 2014-08-14: 50 mg via ORAL
  Filled 2014-08-08: qty 1

## 2014-08-08 MED ORDER — SODIUM CHLORIDE 0.9 % IJ SOLN
9.0000 mL | INTRAMUSCULAR | Status: DC | PRN
Start: 2014-08-08 — End: 2014-08-10

## 2014-08-08 MED ORDER — ONDANSETRON HCL 4 MG/2ML IJ SOLN: 4.0000 mg | Freq: Four times a day (QID) | INTRAMUSCULAR | Status: DC | PRN

## 2014-08-08 MED ORDER — BUPIVACAINE HCL (PF) 0.5 % IJ SOLN
INTRAMUSCULAR | Status: AC
  Filled 2014-08-08: qty 10

## 2014-08-08 MED ORDER — BUPIVACAINE 0.5 % ON-Q PUMP SINGLE CATH 400 ML
400.0000 mL | INJECTION | Status: DC
  Filled 2014-08-08: qty 400

## 2014-08-08 MED ORDER — FENTANYL CITRATE 0.05 MG/ML IJ SOLN: 25.0000 ug | INTRAMUSCULAR | Status: DC | PRN

## 2014-08-08 MED ORDER — LACTATED RINGERS IV SOLN
INTRAVENOUS | Status: DC | PRN
  Administered 2014-08-08: 08:00:00 via INTRAVENOUS

## 2014-08-08 MED ORDER — ACETAMINOPHEN 500 MG PO TABS
1000.0000 mg | ORAL_TABLET | Freq: Four times a day (QID) | ORAL | Status: AC
  Administered 2014-08-08 – 2014-08-11 (×10): 1000 mg via ORAL
  Filled 2014-08-08 (×18): qty 2

## 2014-08-08 MED ORDER — NEOSTIGMINE METHYLSULFATE 10 MG/10ML IV SOLN
INTRAVENOUS | Status: DC | PRN
  Administered 2014-08-08: 5 mg via INTRAVENOUS

## 2014-08-08 MED ORDER — FENTANYL CITRATE 0.05 MG/ML IJ SOLN
INTRAMUSCULAR | Status: AC
  Filled 2014-08-08: qty 5

## 2014-08-08 MED ORDER — HYDROCHLOROTHIAZIDE 25 MG PO TABS
25.0000 mg | ORAL_TABLET | Freq: Every day | ORAL | Status: DC
  Administered 2014-08-08 – 2014-08-14 (×7): 25 mg via ORAL
  Filled 2014-08-08 (×7): qty 1

## 2014-08-08 MED ORDER — PHENYLEPHRINE HCL 10 MG/ML IJ SOLN
10.0000 mg | INTRAVENOUS | Status: DC | PRN
  Administered 2014-08-08: 20 ug/min via INTRAVENOUS

## 2014-08-08 MED ORDER — POTASSIUM CHLORIDE 10 MEQ/50ML IV SOLN
10.0000 meq | Freq: Every day | INTRAVENOUS | Status: DC | PRN
Start: 2014-08-08 — End: 2014-08-14
  Filled 2014-08-08: qty 50

## 2014-08-08 MED ORDER — OXYCODONE HCL 5 MG PO TABS: 5.0000 mg | ORAL_TABLET | Freq: Once | ORAL | Status: DC | PRN

## 2014-08-08 MED ORDER — BUPIVACAINE ON-Q PAIN PUMP (FOR ORDER SET NO CHG)
INJECTION | Status: DC
  Filled 2014-08-08: qty 1

## 2014-08-08 MED ORDER — ONDANSETRON HCL 4 MG/2ML IJ SOLN
INTRAMUSCULAR | Status: AC
  Filled 2014-08-08: qty 2

## 2014-08-08 MED ORDER — MIDAZOLAM HCL 2 MG/2ML IJ SOLN
INTRAMUSCULAR | Status: AC
  Filled 2014-08-08: qty 2

## 2014-08-08 MED ORDER — CALCIUM CARBONATE-VITAMIN D 500-200 MG-UNIT PO TABS
1.0000 | ORAL_TABLET | Freq: Every day | ORAL | Status: DC
  Administered 2014-08-09 – 2014-08-14 (×6): 1 via ORAL
  Filled 2014-08-08 (×7): qty 1

## 2014-08-08 MED ORDER — INSULIN ASPART 100 UNIT/ML ~~LOC~~ SOLN
0.0000 [IU] | Freq: Four times a day (QID) | SUBCUTANEOUS | Status: DC
  Administered 2014-08-08: 4 [IU] via SUBCUTANEOUS
  Administered 2014-08-09: 2 [IU] via SUBCUTANEOUS

## 2014-08-08 MED ORDER — PHENYLEPHRINE HCL 10 MG/ML IJ SOLN
INTRAMUSCULAR | Status: DC | PRN
  Administered 2014-08-08: 40 ug via INTRAVENOUS
  Administered 2014-08-08: 80 ug via INTRAVENOUS
  Administered 2014-08-08: 40 ug via INTRAVENOUS

## 2014-08-08 MED ORDER — GLYCOPYRROLATE 0.2 MG/ML IJ SOLN
INTRAMUSCULAR | Status: DC | PRN
  Administered 2014-08-08: .8 mg via INTRAVENOUS

## 2014-08-08 MED ORDER — PANTOPRAZOLE SODIUM 40 MG PO TBEC
40.0000 mg | DELAYED_RELEASE_TABLET | Freq: Every day | ORAL | Status: DC
  Administered 2014-08-08 – 2014-08-14 (×7): 40 mg via ORAL
  Filled 2014-08-08 (×5): qty 1

## 2014-08-08 MED ORDER — MIDAZOLAM HCL 5 MG/5ML IJ SOLN
INTRAMUSCULAR | Status: DC | PRN
  Administered 2014-08-08: .5 mg via INTRAVENOUS
  Administered 2014-08-08: 1 mg via INTRAVENOUS

## 2014-08-08 MED ORDER — DEXAMETHASONE SODIUM PHOSPHATE 4 MG/ML IJ SOLN
INTRAMUSCULAR | Status: AC
  Filled 2014-08-08: qty 1

## 2014-08-08 MED ORDER — BUPIVACAINE 0.5 % ON-Q PUMP SINGLE CATH 400 ML
INJECTION | Status: DC | PRN
  Administered 2014-08-08: 400 mL

## 2014-08-08 MED ORDER — KCL IN DEXTROSE-NACL 20-5-0.9 MEQ/L-%-% IV SOLN
INTRAVENOUS | Status: DC
Start: 2014-08-08 — End: 2014-08-11
  Administered 2014-08-08 – 2014-08-09 (×2): via INTRAVENOUS
  Filled 2014-08-08 (×5): qty 1000

## 2014-08-08 MED ORDER — OXYCODONE HCL 5 MG/5ML PO SOLN: 5.0000 mg | Freq: Once | ORAL | Status: DC | PRN

## 2014-08-08 MED ORDER — ROCURONIUM BROMIDE 100 MG/10ML IV SOLN
INTRAVENOUS | Status: DC | PRN
  Administered 2014-08-08: 50 mg via INTRAVENOUS
  Administered 2014-08-08: 10 mg via INTRAVENOUS

## 2014-08-08 MED ORDER — PHENYLEPHRINE 40 MCG/ML (10ML) SYRINGE FOR IV PUSH (FOR BLOOD PRESSURE SUPPORT)
PREFILLED_SYRINGE | INTRAVENOUS | Status: AC
  Filled 2014-08-08: qty 10

## 2014-08-08 MED ORDER — DIPHENHYDRAMINE HCL 50 MG/ML IJ SOLN: 12.5000 mg | Freq: Four times a day (QID) | INTRAMUSCULAR | Status: DC | PRN

## 2014-08-08 MED ORDER — ARTIFICIAL TEARS OP OINT
TOPICAL_OINTMENT | OPHTHALMIC | Status: AC
  Filled 2014-08-08: qty 3.5

## 2014-08-08 MED ORDER — NEOSTIGMINE METHYLSULFATE 10 MG/10ML IV SOLN
INTRAVENOUS | Status: AC
  Filled 2014-08-08: qty 1

## 2014-08-08 MED ORDER — ASPIRIN EC 81 MG PO TBEC
81.0000 mg | DELAYED_RELEASE_TABLET | Freq: Every day | ORAL | Status: DC
  Administered 2014-08-09 – 2014-08-14 (×6): 81 mg via ORAL
  Filled 2014-08-08 (×6): qty 1

## 2014-08-08 SURGICAL SUPPLY — 90 items
APPLICATOR TIP COSEAL (VASCULAR PRODUCTS) IMPLANT
APPLICATOR TIP EXT COSEAL (VASCULAR PRODUCTS) IMPLANT
BLADE SURG 10 STRL SS (BLADE) ×3 IMPLANT
BLADE SURG 11 STRL SS (BLADE) ×3 IMPLANT
BRUSH CYTOL CELLEBRITY 1.5X140 (MISCELLANEOUS) IMPLANT
CANISTER SUCTION 2500CC (MISCELLANEOUS) ×3 IMPLANT
CATH KIT ON Q 5IN SLV (PAIN MANAGEMENT) ×3 IMPLANT
CATH THORACIC 28FR (CATHETERS) ×3 IMPLANT
CATH THORACIC 36FR (CATHETERS) IMPLANT
CATH THORACIC 36FR RT ANG (CATHETERS) IMPLANT
CLIP TI MEDIUM 6 (CLIP) ×3 IMPLANT
CONN ST 1/4X3/8  BEN (MISCELLANEOUS)
CONN ST 1/4X3/8 BEN (MISCELLANEOUS) IMPLANT
CONT SPEC 4OZ CLIKSEAL STRL BL (MISCELLANEOUS) ×12 IMPLANT
COVER TABLE BACK 60X90 (DRAPES) ×3 IMPLANT
DERMABOND ADVANCED (GAUZE/BANDAGES/DRESSINGS) ×1
DERMABOND ADVANCED .7 DNX12 (GAUZE/BANDAGES/DRESSINGS) ×2 IMPLANT
DISSECTOR BLUNT TIP ENDO 5MM (MISCELLANEOUS) ×3 IMPLANT
DRAIN CHANNEL 28F RND 3/8 FF (WOUND CARE) IMPLANT
DRAIN CHANNEL 32F RND 10.7 FF (WOUND CARE) IMPLANT
DRAPE LAPAROSCOPIC ABDOMINAL (DRAPES) ×3 IMPLANT
DRAPE PROXIMA HALF (DRAPES) ×6 IMPLANT
DRAPE WARM FLUID 44X44 (DRAPE) ×3 IMPLANT
DRILL BIT 7/64X5 (BIT) IMPLANT
ELECT BLADE 4.0 EZ CLEAN MEGAD (MISCELLANEOUS) ×3
ELECT REM PT RETURN 9FT ADLT (ELECTROSURGICAL) ×3
ELECTRODE BLDE 4.0 EZ CLN MEGD (MISCELLANEOUS) ×2 IMPLANT
ELECTRODE REM PT RTRN 9FT ADLT (ELECTROSURGICAL) ×2 IMPLANT
FORCEPS BIOP RJ4 1.8 (CUTTING FORCEPS) IMPLANT
GAUZE SPONGE 4X4 12PLY STRL (GAUZE/BANDAGES/DRESSINGS) ×3 IMPLANT
GLOVE BIO SURGEON STRL SZ 6.5 (GLOVE) ×12 IMPLANT
GLOVE BIOGEL PI IND STRL 6 (GLOVE) ×6 IMPLANT
GLOVE BIOGEL PI IND STRL 6.5 (GLOVE) ×4 IMPLANT
GLOVE BIOGEL PI INDICATOR 6 (GLOVE) ×3
GLOVE BIOGEL PI INDICATOR 6.5 (GLOVE) ×2
GOWN STRL REUS W/ TWL LRG LVL3 (GOWN DISPOSABLE) ×10 IMPLANT
GOWN STRL REUS W/TWL LRG LVL3 (GOWN DISPOSABLE) ×5
HANDLE STAPLE ENDO GIA SHORT (STAPLE) ×1
HEMOSTAT SURGICEL 2X14 (HEMOSTASIS) ×3 IMPLANT
KIT BASIN OR (CUSTOM PROCEDURE TRAY) ×3 IMPLANT
KIT ROOM TURNOVER OR (KITS) ×3 IMPLANT
KIT SUCTION CATH 14FR (SUCTIONS) ×3 IMPLANT
MARKER SKIN DUAL TIP RULER LAB (MISCELLANEOUS) ×3 IMPLANT
NEEDLE BIOPSY TRANSBRONCH 21G (NEEDLE) IMPLANT
NS IRRIG 1000ML POUR BTL (IV SOLUTION) ×9 IMPLANT
OIL SILICONE PENTAX (PARTS (SERVICE/REPAIRS)) ×3 IMPLANT
PACK CHEST (CUSTOM PROCEDURE TRAY) ×3 IMPLANT
PAD ARMBOARD 7.5X6 YLW CONV (MISCELLANEOUS) ×9 IMPLANT
PASSER SUT SWANSON 36MM LOOP (INSTRUMENTS) ×3 IMPLANT
RELOAD EGIA 45 MED/THCK PURPLE (STAPLE) ×15 IMPLANT
RELOAD EGIA 60 MED/THCK PURPLE (STAPLE) ×12 IMPLANT
SCISSORS LAP 5X35 DISP (ENDOMECHANICALS) IMPLANT
SEALANT PROGEL (MISCELLANEOUS) ×3 IMPLANT
SEALANT SURG COSEAL 4ML (VASCULAR PRODUCTS) IMPLANT
SEALANT SURG COSEAL 8ML (VASCULAR PRODUCTS) IMPLANT
SOLUTION ANTI FOG 6CC (MISCELLANEOUS) ×3 IMPLANT
SPONGE GAUZE 4X4 12PLY STER LF (GAUZE/BANDAGES/DRESSINGS) ×3 IMPLANT
STAPLER ENDO GIA 12MM SHORT (STAPLE) ×2 IMPLANT
SUT PROLENE 3 0 SH DA (SUTURE) IMPLANT
SUT PROLENE 4 0 RB 1 (SUTURE)
SUT PROLENE 4-0 RB1 .5 CRCL 36 (SUTURE) IMPLANT
SUT SILK  1 MH (SUTURE) ×2
SUT SILK 1 MH (SUTURE) ×4 IMPLANT
SUT SILK 2 0 SH (SUTURE) IMPLANT
SUT SILK 2 0SH CR/8 30 (SUTURE) ×3 IMPLANT
SUT SILK 3 0SH CR/8 30 (SUTURE) IMPLANT
SUT STEEL 1 (SUTURE) IMPLANT
SUT VIC AB 1 CTX 18 (SUTURE) ×3 IMPLANT
SUT VIC AB 1 CTX 36 (SUTURE)
SUT VIC AB 1 CTX36XBRD ANBCTR (SUTURE) IMPLANT
SUT VIC AB 2-0 CTX 36 (SUTURE) ×3 IMPLANT
SUT VIC AB 2-0 UR6 27 (SUTURE) IMPLANT
SUT VIC AB 3-0 SH 8-18 (SUTURE) IMPLANT
SUT VIC AB 3-0 X1 27 (SUTURE) ×3 IMPLANT
SUT VICRYL 2 TP 1 (SUTURE) ×3 IMPLANT
SWAB COLLECTION DEVICE MRSA (MISCELLANEOUS) IMPLANT
SYR 20ML ECCENTRIC (SYRINGE) ×3 IMPLANT
SYSTEM SAHARA CHEST DRAIN ATS (WOUND CARE) ×3 IMPLANT
TAPE CLOTH SURG 4X10 WHT LF (GAUZE/BANDAGES/DRESSINGS) ×3 IMPLANT
TAPE UMBILICAL COTTON 1/8X30 (MISCELLANEOUS) ×3 IMPLANT
TIP APPLICATOR SPRAY EXTEND 16 (VASCULAR PRODUCTS) ×3 IMPLANT
TOWEL OR 17X24 6PK STRL BLUE (TOWEL DISPOSABLE) ×6 IMPLANT
TOWEL OR 17X26 10 PK STRL BLUE (TOWEL DISPOSABLE) ×6 IMPLANT
TRAP SPECIMEN MUCOUS 40CC (MISCELLANEOUS) ×3 IMPLANT
TRAY FOLEY CATH 16FRSI W/METER (SET/KITS/TRAYS/PACK) ×3 IMPLANT
TROCAR XCEL BLUNT TIP 100MML (ENDOMECHANICALS) IMPLANT
TUBE ANAEROBIC SPECIMEN COL (MISCELLANEOUS) IMPLANT
TUBE CONNECTING 12X1/4 (SUCTIONS) ×6 IMPLANT
TUNNELER SHEATH ON-Q 11GX8 DSP (PAIN MANAGEMENT) ×3 IMPLANT
WATER STERILE IRR 1000ML POUR (IV SOLUTION) ×6 IMPLANT

## 2014-08-08 NOTE — Brief Op Note (Addendum)
      [address]       [place] [address]             [phone]      08/08/2014  11:37 AM  PATIENT:  Nicole Ortiz  78 y.o. female  PRE-OPERATIVE DIAGNOSIS:  left upper lobe lung lesion  POST-OPERATIVE DIAGNOSIS:  left upper lobe lung with lymphoid infiltrates (pending final pathology report)  PROCEDURE:VIDEO BRONCHOSCOPY, LEFT VIDEO ASSISTED THORACOSCOPY (VATS)/WEDGE RESECTION x2 WITH PLACEMENT of On-Q (Left) LYMPH NODE BIOPSY   SURGEON:  Surgeon(s) and Role:    * Grace Isaac, MD - Primary  PHYSICIAN ASSISTANT: Lars Pinks PA-C  ANESTHESIA:   general  EBL:  Total I/O In: 1600 [I.V.:1600] Out: [place] [Urine:1065]  BLOOD ADMINISTERED:none  DRAINS: 28 French chest tube in the left pleural space   LOCAL MEDICATIONS USED:  BUPIVICAINE   SPECIMEN:  Source of Specimen:  Wedge LUL x 2, lymph nodes  DISPOSITION OF SPECIMEN:  Pathology and culture  COUNTS CORRECT:  YES  DICTATION: .Dragon Dictation  PLAN OF CARE: Admit to inpatient   PATIENT DISPOSITION:  PACU - hemodynamically stable.   Delay start of Pharmacological VTE agent (>24hrs) due to surgical blood loss or risk of bleeding: yes

## 2014-08-08 NOTE — Transfer of Care (Signed)
Immediate Anesthesia Transfer of Care Note  Patient: Nicole Ortiz  Procedure(s) Performed: Procedure(s): VIDEO BRONCHOSCOPY (N/A) VIDEO ASSISTED THORACOSCOPY (VATS)/WEDGE RESECTION x2 with placement of mediastinal On-Q (Left) LYMPH NODE BIOPSY (Left)  Patient Location: PACU  Anesthesia Type:General  Level of Consciousness: awake, alert  and oriented  Airway & Oxygen Therapy: Patient Spontanous Breathing and Patient connected to face mask oxygen  Post-op Assessment: Report given to PACU RN  Post vital signs: Reviewed and stable  Complications: No apparent anesthesia complications

## 2014-08-08 NOTE — H&P (Signed)
[address]       ,[place] [address]             [phone]                    Nicole Ortiz [place] Medical Record #[id number] Date of Birth: Apr 26, 1936  Referring: Dr Alcide Clever Primary Care: Alma Friendly, MD  Chief Complaint:  Abnormal CT scan  History of Present Illness:    Nicole Ortiz 78 y.o. female seen in the office   at the request of Dr. Alcide Clever. Last year the patient had a thyroid nodule and cyst she had a thyroidectomy at that time. Subsequently she was treated with I-131 for thyroid cancer,Sclerosing papillary thyroid carcinoma, follicular variant, multifocal. CT scan in December 2014 suggested lung mass, followup CT scan and PET scan was done recently and the patient is referred for evaluation of lung mass, described as a groundglass opacity in the left lung apex 2.1 cm with an SUV of 4.0. The patient is a lifelong nonsmoker. She denies any respiratory difficulty denies hemoptysis.    Current Activity/ Functional Status:  Patient is independent with mobility/ambulation, transfers, ADL's, IADL's.   Zubrod Score: At the time of surgery this patient's most appropriate activity status/level should be described as: [x]     0    Normal activity, no symptoms []     1    Restricted in physical strenuous activity but ambulatory, able to do out light work []     2    Ambulatory and capable of self care, unable to do work activities, up and about               >50 % of waking hours                              []     3    Only limited self care, in bed greater than 50% of waking hours []     4    Completely disabled, no self care, confined to bed or chair []     5    Moribund   Past Medical History  Diagnosis Date  . Hypertension   . Hyperlipidemia   . Arthritis   . H/O: hysterectomy   . Lesion of left lung 11/22/13    left upper lobe  . Cancer     thyroid  . Shortness of breath     at times  . Hypothyroidism   . CAD (coronary artery disease)      with stent placement. Pt denies CAD or stent placement; had coronary calcifications on CT-had no ischemia, NL LVEF on stress echo 12/28/13 (Dr. Otho Perl; [facility])    Past Surgical History  Procedure Laterality Date  . Total thyroidectomy    . Eye surgery    . Retinal detachment surgery    . Bladder surgery    . Abdominal hysterectomy      Family History: Father died mi 3 brothers alive, married 2 children   History   Social History  . Marital Status: Married    Spouse Name: N/A    Number of Children: N/A  . Years of Education: N/A   Occupational History  . Worked as Art gallery manager   Social History Main Topics  . Smoking status: Never Smoker   . Smokeless tobacco: Not on file  . Alcohol Use: No  . Drug Use: Not on  file  . Sexual Activity: Not on file      History  Smoking status  . Never Smoker   Smokeless tobacco  . Never Used    History  Alcohol Use No     Allergies  Allergen Reactions  . Chlorthalidone Cough  . Lipitor [Atorvastatin]     Muscle weakness  . Zocor [Simvastatin]     Muscle weakness     Current Facility-Administered Medications  Medication Dose Route Frequency Provider Last Rate Last Dose  . cefUROXime (ZINACEF) 1.5 g in dextrose 5 % 50 mL IVPB  1.5 g Intravenous 60 min Pre-Op Grace Isaac, MD         Review of Systems:     Cardiac Review of Systems: Y or N  Chest Pain [ n   ]  Resting SOB [n   ] Exertional SOB  [n  ]  Orthopnea [ n ]   Pedal Edema [  n ]    Palpitations [n ] Syncope  [ n ]   Presyncope [  n]  General Review of Systems: [Y] = yes [  ]=no Constitional: recent weight change [ n ];  Wt loss over the last 3 months [   ] anorexia [  ]; fatigue [  ]; nausea [  ]; night sweats [  ]; fever [  ]; or chills [  ];          Dental: poor dentition[ n ]; Last Dentist visit:   Eye : blurred vision [  ]; diplopia [   ]; vision changes [  ];  Amaurosis fugax[  ]; Resp: cough [  ];  wheezing[n  ];  hemoptysis[n  ]; shortness  of breath[ n ]; paroxysmal nocturnal dyspnea[ n ]; dyspnea on exertion[  ]; or orthopnea[  ];  GI:  gallstones[  ], vomiting[  ];  dysphagia[  ]; melena[  ];  hematochezia [  ]; heartburn[  ];   Hx of  Colonoscopy[  ]; GU: kidney stones [  ]; hematuria[  ];   dysuria [  ];  nocturia[  ];  history of     obstruction [  ]; urinary frequency [  ]             Skin: rash, swelling[  ];, hair loss[  ];  peripheral edema[  ];  or itching[  ]; Musculosketetal: myalgias[  ];  joint swelling[  ];  joint erythema[  ];  joint pain[  ];  back pain[  ];  Heme/Lymph: bruising[  ];  bleeding[  ];  anemia[  ];  Neuro: TIA[n  ];  headaches[  ];  stroke[ n ];  vertigo[  ];  seizures[  ];   paresthesias[  ];  difficulty walking[  ];  Psych:depression[  ]; anxiety[  ];  Endocrine: diabetes[  ];  thyroid dysfunction[  ];  Immunizations: Flu up to date [ ? ]; Pneumococcal up to date [?  ];  Other:  Physical Exam: BP 181/87  Pulse 79  Temp(Src) 97.2 F (36.2 C) (Oral)  Resp 20  Ht 5\' 7"  (1.702 m)  Wt 182 lb (82.555 kg)  BMI 28.50 kg/m2  SpO2 99%  PHYSICAL EXAMINATION:  General appearance: alert, cooperative and appears stated age Neurologic: intact Heart: regular rate and rhythm, S1, S2 normal, no murmur, click, rub or gallop Lungs: clear to auscultation bilaterally Abdomen: soft, non-tender; bowel sounds normal; no masses,  no organomegaly Extremities: extremities normal, atraumatic, no  cyanosis or edema and Homans sign is negative, no sign of DVT Patient has no carotid bruits, well-healed thyroidectomy scar.  Shows full DP and PT pulses bilaterally  Diagnostic Studies & Laboratory data:     Recent Radiology Findings:  IMPRESSION: Well-circumscribed groundglass opacity. In the left lung apex has increased in size. Infection than neoplasm such as BAC are probably highest on the long list of differential possibilities.   Result Narrative  TECHNIQUE: CT chest without contrast. Comparison:   11/22/2013.  FINDINGS: Well-circumscribed area of groundglass opacity in the posterior left apex is larger measuring 26 mm on image #20, previously 19 mm. Mild scarring in both lung apices is stable. No other infiltrates or effusions are present. Lower lobes show  early bronchiectasis and peripheral fibrosis. Thoracic lymph nodes are normal in size. Major airways are patent. Coronary arteries are heavily atherosclerotic. Heart size is normal. No acute findings in the visible abdominal organs.   CLINICAL DATA: Initial Treatment strategy for lung nodule. History of thyroid cancer.Marland Kitchen  EXAM: NUCLEAR MEDICINE PET SKULL BASE TO THIGH  TECHNIQUE: Twenty mCi F-18 FDG was injected intravenously. Full-ring PET imaging was performed from the skull base to thigh after the radiotracer. CT data was obtained and used for attenuation correction and anatomic localization.  FASTING BLOOD GLUCOSE: Value: 90 mg/dl  COMPARISON: Outside images dated 06/07/2014  FINDINGS: NECK  No hypermetabolic lymph nodes in the neck. Thyroid gland absent.  CHEST  Apical segment ground-glass density left upper lobe nodule, 2.1 cm in diameter, maximum standard uptake value 4.0. Coronary stent noted.  ABDOMEN/PELVIS  No abnormal hypermetabolic activity within the liver, pancreas, adrenal glands, or spleen. No hypermetabolic lymph nodes in the abdomen or pelvis. Bilateral renal cysts include a 10 mm complex cyst of the left kidney lower pole which does not appear hypermetabolic and of 11 mm cyst of the right kidney upper pole which does not appear hypermetabolic. Aortoiliac atherosclerotic vascular disease.  SKELETON  No focal hypermetabolic activity to suggest skeletal metastasis. Faintly increased activity associated with left eccentric facet arthropathy at 1 location in the cervical spine and several locations in the lower lumbar spine.  IMPRESSION: 1. Ground-glass density 2.1 cm apical segment left upper  lobe nodule is hypermetabolic, with maximum SUV of 4.0. This would be a very unusual appearance for metastatic thyroid cancer and is more likely to represent low grade lung adenocarcinoma. No associated adenopathy. 2. Atherosclerosis. 3. Bilateral renal cysts, including several small complex renal cysts which do not have obvious associated increased metabolic activity and are accordingly probably benign.   Electronically Signed By: Sherryl Barters M.D. On: 06/21/2014 11:54     Recent Lab Findings: Lab Results  Component Value Date   WBC 5.5 08/06/2014   HGB 12.9 08/06/2014   HCT 37.8 08/06/2014   PLT 231 08/06/2014   GLUCOSE 95 08/06/2014   ALT 14 08/06/2014   AST 19 08/06/2014   NA 138 08/06/2014   K 4.2 08/06/2014   CL 100 08/06/2014   CREATININE 0.68 08/06/2014   BUN 17 08/06/2014   CO2 22 08/06/2014   INR 0.97 08/06/2014   Lab Results  Component Value Date   WBC 5.5 08/06/2014   PATH: 07-03-2013  Diagnosis   Thyroid, total resection:  Sclerosing papillary thyroid carcinoma, follicular variant,  multifocal.  Nodular hyperplasia with cystic change, Hurthle cell metaplasia, and  dystrophic calcification.  Hashimoto's thyroiditis.  Benign-appearing parathyroid tissue.  See template and comment below.  (esn[mns])    TEMPLATE FOR THYROID CARCINOMA  PROCEDURE/SPECIMEN TYPE: Total thyroidectomy. SPECIMEN INTEGRITY: Intact. SPECIMEN SIZE: 29.7 gm, 5.2 x 3.6 x 2.0 cm right  lobe, 4.4 x 1.9 x 1.8 cm left lobe,  and 3.0 x 0.9 x 0.4 cm isthmus. LOCATION: Left lobe and isthmus. FOCALITY: Multifocal. HISTOPATHOLOGIC TYPE: Papillary thyroid carcinoma. SIZE: 0.8 cm. GRADE: G1 (well differentiated). TUMOR ENCAPSULATION: No. TUMOR CAPSULE INVASION: Not identified. ANGIOLYMPHATIC INVASION: Not identified. RESECTION MARGINS: Negative. EXTRATHYROIDAL EXTENSION: Not identified. TOTAL NODES EXAMINED: 0. NODES CONTAINING METASTASES: Not applicable. SPECIAL STUDIES: Not  performed. TNM STAGE: T1a NX M0. AJCC STAGE GROUPING: I.  Comment  There are at least three foci of papillary thyroid carcinoma present in  the left lobe (up to 0.8 cm) and one focus within the isthmus (0.3 cm).    Gayla Doss MD  Pathologist   PFT's  FEV1 1.89  89%  No diffusion FEV1 2.0 85%  DLCO 16.54 58%  Stree test 12/2013  [facility]- Dr Otho Perl negative with limited exercise   Assessment / Plan:   Patient with history of thyroid cancer and suspicious left upper lobe lung lesion CT and PET scan. I recommended to the patient that we proceed with resection. She  obtained cardiology clearance from Dr. Otho Perl who is seen her in the past and did a stress test in February 2015.   The goals risks and alternatives of the planned surgical procedure bronchoscopy, left vats lung resection  have been discussed with the patient in detail. The risks of the procedure including death, infection, stroke, myocardial infarction, bleeding, blood transfusion have all been discussed specifically.  I have quoted Gildardo Pounds a 2% of perioperative mortality and a complication rate as high as 20 %. The patient's questions have been answered.DODI LEU is willing  to proceed with the planned procedure.    Grace Isaac MD      [address] [place] [address] Office [phone]   Beeper [phone]  08/08/2014 7:10 AM

## 2014-08-08 NOTE — Anesthesia Postprocedure Evaluation (Signed)
  Anesthesia Post-op Note  Patient: Nicole Ortiz  Procedure(s) Performed: Procedure(s): VIDEO BRONCHOSCOPY (N/A) VIDEO ASSISTED THORACOSCOPY (VATS)/WEDGE RESECTION x2 with placement of mediastinal On-Q (Left) LYMPH NODE BIOPSY (Left)  Patient Location: PACU  Anesthesia Type:General  Level of Consciousness: awake, alert  and oriented  Airway and Oxygen Therapy: Patient Spontanous Breathing  Post-op Pain: none  Post-op Assessment: Post-op Vital signs reviewed  Post-op Vital Signs: Reviewed  Last Vitals:  Filed Vitals:   08/08/14 1259  BP: 129/75  Pulse: 75  Temp:   Resp: 19    Complications: No apparent anesthesia complications

## 2014-08-08 NOTE — Anesthesia Procedure Notes (Signed)
Procedures

## 2014-08-08 NOTE — Progress Notes (Signed)
Report given to diane walker rn as cargiver

## 2014-08-08 NOTE — Anesthesia Preprocedure Evaluation (Signed)
Anesthesia Evaluation  Patient identified by MRN, date of birth, ID band Patient awake    Reviewed: Allergy & Precautions, H&P , NPO status , Patient's Chart, lab work & pertinent test results  Airway       Dental   Pulmonary neg pulmonary ROS,          Cardiovascular hypertension, + CAD  2/15: Normal ESE. EF 60%.   Neuro/Psych negative neurological ROS  negative psych ROS   GI/Hepatic negative GI ROS, Neg liver ROS,   Endo/Other  Hypothyroidism   Renal/GU negative Renal ROS     Musculoskeletal negative musculoskeletal ROS (+)   Abdominal   Peds  Hematology negative hematology ROS (+)   Anesthesia Other Findings   Reproductive/Obstetrics                           Anesthesia Physical Anesthesia Plan  ASA: II  Anesthesia Plan: General   Post-op Pain Management:    Induction: Intravenous  Airway Management Planned: Oral ETT  Additional Equipment: Arterial line  Intra-op Plan:   Post-operative Plan: Extubation in OR  Informed Consent: I have reviewed the patients History and Physical, chart, labs and discussed the procedure including the risks, benefits and alternatives for the proposed anesthesia with the patient or authorized representative who has indicated his/her understanding and acceptance.   Dental advisory given  Plan Discussed with: CRNA, Anesthesiologist and Surgeon  Anesthesia Plan Comments:         Anesthesia Quick Evaluation

## 2014-08-09 ENCOUNTER — Encounter (HOSPITAL_COMMUNITY): Payer: Self-pay | Admitting: Cardiothoracic Surgery

## 2014-08-09 ENCOUNTER — Inpatient Hospital Stay (HOSPITAL_COMMUNITY): Payer: Medicare PPO

## 2014-08-09 LAB — POCT I-STAT 3, ART BLOOD GAS (G3+)
Bicarbonate: 25.6 mEq/L — ABNORMAL HIGH (ref 20.0–24.0)
O2 Saturation: 94 %
Patient temperature: 98.5
TCO2: 27 mmol/L (ref 0–100)
pCO2 arterial: 42.4 mmHg (ref 35.0–45.0)
pH, Arterial: 7.389 (ref 7.350–7.450)
pO2, Arterial: 70 mmHg — ABNORMAL LOW (ref 80.0–100.0)

## 2014-08-09 LAB — BASIC METABOLIC PANEL
Anion gap: 13 (ref 5–15)
BUN: 9 mg/dL (ref 6–23)
CO2: 23 mEq/L (ref 19–32)
Calcium: 8.1 mg/dL — ABNORMAL LOW (ref 8.4–10.5)
Chloride: 106 mEq/L (ref 96–112)
Creatinine, Ser: 0.57 mg/dL (ref 0.50–1.10)
GFR calc non Af Amer: 88 mL/min — ABNORMAL LOW (ref >90)
GLUCOSE: 119 mg/dL — AB (ref 70–99)
POTASSIUM: 4.2 meq/L (ref 3.7–5.3)
SODIUM: 142 meq/L (ref 137–147)

## 2014-08-09 LAB — CBC
HCT: 32.1 % — ABNORMAL LOW (ref 36.0–46.0)
HEMOGLOBIN: 10.7 g/dL — AB (ref 12.0–15.0)
MCH: 29.2 pg (ref 26.0–34.0)
MCHC: 33.3 g/dL (ref 30.0–36.0)
MCV: 87.7 fL (ref 78.0–100.0)
Platelets: 207 10*3/uL (ref 150–400)
RBC: 3.66 MIL/uL — ABNORMAL LOW (ref 3.87–5.11)
RDW: 14 % (ref 11.5–15.5)
WBC: 5.1 10*3/uL (ref 4.0–10.5)

## 2014-08-09 NOTE — Progress Notes (Signed)
Utilization review completed. Salik Grewell, RN, BSN. 

## 2014-08-09 NOTE — Progress Notes (Addendum)
      [address]       [place] [address]             [phone]      1 Day Post-Op Procedure(s) (LRB): VIDEO BRONCHOSCOPY (N/A) VIDEO ASSISTED THORACOSCOPY (VATS)/WEDGE RESECTION x2 with placement of mediastinal On-Q (Left) LYMPH NODE BIOPSY (Left)  Subjective:  Ms. Palleschi states she is doing pretty good.  She has some pain with coughing.  She is motivated to get up and moving around.  Objective: Vital signs in last 24 hours: Temp:  [97.4 F (36.3 C)-98.7 F (37.1 C)] 98.5 F (36.9 C) (09/17 0400) Pulse Rate:  [61-97] 70 (09/17 0700) Cardiac Rhythm:  [-] Normal sinus rhythm (09/17 0600) Resp:  [12-24] 20 (09/17 0700) BP: (85-145)/(46-88) 136/67 mmHg (09/17 0700) SpO2:  [92 %-100 %] 97 % (09/17 0700) Arterial Line BP: (93-174)/(42-100) 115/92 mmHg (09/17 0600)  Intake/Output from previous day: 09/16 0701 - 09/17 0700 In: 4235 [P.O.:240; I.V.:3895; IV Piggyback:100] Out: 5018 [Urine:4600; Blood:100; Chest Tube:318]  General appearance: alert, cooperative and no distress Heart: regular rate and rhythm Lungs: clear to auscultation bilaterally Abdomen: soft, non-tender; bowel sounds normal; no masses,  no organomegaly Wound: clean and dry  Lab Results:  Recent Labs  08/06/14 1204 08/09/14 0316  WBC 5.5 5.1  HGB 12.9 10.7*  HCT 37.8 32.1*  PLT 231 207   BMET:  Recent Labs  08/06/14 1204 08/09/14 0316  NA 138 142  K 4.2 4.2  CL 100 106  CO2 22 23  GLUCOSE 95 119*  BUN 17 9  CREATININE 0.68 0.57  CALCIUM 9.9 8.1*    PT/INR:  Recent Labs  08/06/14 1204  LABPROT 12.9  INR 0.97   ABG    Component Value Date/Time   PHART 7.389 08/09/2014 0417   HCO3 25.6* 08/09/2014 0417   TCO2 27 08/09/2014 0417   O2SAT 94.0 08/09/2014 0417   CBG (last 3)   Recent Labs  08/08/14 1826 08/08/14 2324  GLUCAP 162* 160*    Assessment/Plan: S/P Procedure(s) (LRB): VIDEO BRONCHOSCOPY (N/A) VIDEO ASSISTED THORACOSCOPY (VATS)/WEDGE RESECTION x2  with placement of mediastinal On-Q (Left) LYMPH NODE BIOPSY (Left)  1. CV- NSR, H/O HTN- controlled on home Norvasc, Avapro 2. Pulm- wean oxygen as tolerated, Chest tube with minimal air leak with cough- will transition to water seal 3. Renal- creatinine, lytes okay 4. Expected Acute Post Operative Blood Loss, Mild Hgb is 10.7 5. D/C Arterial Line 6. Decrease IV Fluid to KVO 7. D/C SSIP, glucose checks patient not a diabetic 8. D/C Foley 9. Dispo- patient looks great, doing well, will transfer to 3300   LOS: 1 day    Ellwood Handler 08/09/2014  Path pending I have seen and examined Gildardo Pounds and agree with the above assessment  and plan.  Grace Isaac MD Beeper [phone] Office [phone] 08/09/2014 8:13 AM

## 2014-08-10 ENCOUNTER — Inpatient Hospital Stay (HOSPITAL_COMMUNITY): Payer: Medicare PPO

## 2014-08-10 LAB — COMPREHENSIVE METABOLIC PANEL
ALBUMIN: 3.4 g/dL — AB (ref 3.5–5.2)
ALK PHOS: 72 U/L (ref 39–117)
ALT: 10 U/L (ref 0–35)
AST: 19 U/L (ref 0–37)
Anion gap: 13 (ref 5–15)
BUN: 13 mg/dL (ref 6–23)
CO2: 26 mEq/L (ref 19–32)
Calcium: 9 mg/dL (ref 8.4–10.5)
Chloride: 102 mEq/L (ref 96–112)
Creatinine, Ser: 0.74 mg/dL (ref 0.50–1.10)
GFR calc Af Amer: >90 mL/min (ref >90)
GFR calc non Af Amer: 81 mL/min — ABNORMAL LOW (ref >90)
Glucose, Bld: 93 mg/dL (ref 70–99)
POTASSIUM: 4.1 meq/L (ref 3.7–5.3)
SODIUM: 141 meq/L (ref 137–147)
TOTAL PROTEIN: 7.3 g/dL (ref 6.0–8.3)
Total Bilirubin: 0.3 mg/dL (ref 0.3–1.2)

## 2014-08-10 LAB — CULTURE, RESPIRATORY W GRAM STAIN: Culture: NO GROWTH

## 2014-08-10 LAB — CBC
HEMATOCRIT: 35.9 % — AB (ref 36.0–46.0)
Hemoglobin: 11.9 g/dL — ABNORMAL LOW (ref 12.0–15.0)
MCH: 29.3 pg (ref 26.0–34.0)
MCHC: 33.1 g/dL (ref 30.0–36.0)
MCV: 88.4 fL (ref 78.0–100.0)
PLATELETS: 229 10*3/uL (ref 150–400)
RBC: 4.06 MIL/uL (ref 3.87–5.11)
RDW: 14.2 % (ref 11.5–15.5)
WBC: 5.8 10*3/uL (ref 4.0–10.5)

## 2014-08-10 NOTE — Op Note (Signed)
NAMEBERNEICE, ZETTLEMOYER NO.:  [id number]  MEDICAL RECORD NO.:  [id number]  LOCATION:  2W24C                        FACILITY:  [facility]  PHYSICIAN:  Lanelle Bal, MD    DATE OF BIRTH:  05/18/1936  DATE OF PROCEDURE:  08/08/2014 DATE OF DISCHARGE:                              OPERATIVE REPORT   PREOPERATIVE DIAGNOSIS:  Left upper lobe lung lesion, mildly hypermetabolic.  POSTOPERATIVE DIAGNOSIS:  Inflammatory nodules, left upper lung x2.  No evidence of malignancy.  PROCEDURES PERFORMED:  Bronchoscopy, left video-assisted thoracoscopy, wedge resection of left upper lobe x2 and lymph node biopsy, placement of On-Q device.  SURGEON:  Lanelle Bal, MD  FIRST ASSISTANT:  Lars Pinks, PA  BRIEF HISTORY:  The patient is a 78 year old female who is a lifelong nonsmoker.  She had had a persistent cough and chest x-ray was obtained suggesting a left upper lobe lung nodule.  CT scan confirmed approximately 3-cm upper lobe lung nodule that was mildly hypermetabolic on recent scans.  The patient had initially been evaluated for thyroid nodule that was resected and found to be thyroid carcinoma, she was treated with I131.  During this evaluation, the scans were obtained and it showed on serial scans at that lesion in question in the upper lobe had increased in size.  The patient was referred for surgical resection. Pulmonary function studies were relatively normal.  Risks and options of surgical resection were discussed with the patient, who agreed and signed informed consent.  DESCRIPTION OF PROCEDURE:  The patient underwent general endotracheal anesthesia without incident.  Through a single-lumen endotracheal tube and after appropriate time-out was performed, a fiberoptic bronchoscope was passed to the subsegmental level bilaterally.  There was no evidence of endobronchial lesions.  The scope was removed.  A double-lumen endotracheal tube was placed and the  patient was turned in lateral decubitus position with the left side up.  A small incision was made approximately fourth intercostal space in midaxillary line.  Through this incision, a 30-degree videoscope was placed.  The superior segment of the lower lobe was adherent to the chest wall, this was dissected free.  This allowed after enlarging incision slightly, probably to be able to feel a 2-cm soft nodule.  However, on comparison to the CT scan, the nodule that was palpable was really in the upper lobe, but anteriorly in the lingula and sitting on the diaphragm, this was not readily apparent on the CT scan.  The second lesion, which was the one apparent on the CT scan was more superior and posterior, palpable.  We then proceeded with wedge resection of both of these lesions and submitted to Pathology.  Both lesions appeared similar and were lymphoid infiltrates without evidence of carcinoma.  The first lesion, one more anterior and lower in the lobe still had positive margin.  So on a slightly small additional wedge of this area was obtained.  The patient also had markedly enlarged hilar lymph nodes.  So, level 10 node was removed and submitted as was a 10L node and a 4L node.  With no evidence of malignancy, we elected not to perform a lobectomy.  An On-Q device was tunneled subpleurally  posteriorly.  The lung was reinflated.  The ribs were reapproximated.  A single chest tube was left through lower port site.  The small working incision was then closed with interrupted 0 Vicryl in the muscle layers and a running 2-0 Vicryl in the subcutaneous tissue, and a 4-0 subcuticular stitch.  Dermabond was applied.  The patient was awakened and extubated in the operating room.  She had no air leak at the completion of procedure.  Estimated blood loss was less than 100 mL.  She did not require any blood transfusions.  She was extubated and transferred to the recovery room having tolerated  the procedure without obvious complication.     Lanelle Bal, MD     EG/MEDQ  D:  08/09/2014  T:  08/10/2014  Job:  [id number]

## 2014-08-10 NOTE — Progress Notes (Signed)
Patient ID: Nicole Ortiz, female   DOB: 1935/11/27, 78 y.o.   MRN: [id number]      [address]       [place] [address]             [phone]                 2 Days Post-Op Procedure(s) (LRB): VIDEO BRONCHOSCOPY (N/A) VIDEO ASSISTED THORACOSCOPY (VATS)/WEDGE RESECTION x2 with placement of mediastinal On-Q (Left) LYMPH NODE BIOPSY (Left)  LOS: 2 days   Subjective: Feels well  Objective: Vital signs in last 24 hours: Patient Vitals for the past 24 hrs:  BP Temp Temp src Pulse Resp SpO2  08/10/14 0555 133/83 mmHg 98.3 F (36.8 C) Oral 78 18 96 %  08/10/14 0404 - - - - 18 97 %  08/10/14 0000 136/72 mmHg - - 78 18 98 %  08/09/14 2146 139/77 mmHg 99 F (37.2 C) Oral 81 18 96 %  08/09/14 2000 - - - - 18 96 %  08/09/14 1819 - - - - 16 98 %  08/09/14 1409 115/58 mmHg 98.5 F (36.9 C) Oral 75 15 98 %  08/09/14 1136 152/81 mmHg 97.6 F (36.4 C) Oral 78 16 95 %  08/09/14 1000 159/71 mmHg - - 81 23 96 %  08/09/14 0900 134/73 mmHg - - 84 14 98 %  08/09/14 0800 149/74 mmHg 98.5 F (36.9 C) Oral 83 22 100 %  08/09/14 0700 136/67 mmHg - - 70 20 97 %    Filed Weights   08/08/14 6440  Weight: 182 lb (82.555 kg)    Hemodynamic parameters for last 24 hours:    Intake/Output from previous day: 09/17 0701 - 09/18 0700 In: 837 [P.O.:510; I.V.:327] Out: 580 [Urine:550; Chest Tube:30] Intake/Output this shift:    Scheduled Meds: . acetaminophen  1,000 mg Oral 4 times per day   Or  . acetaminophen (TYLENOL) oral liquid 160 mg/5 mL  1,000 mg Oral 4 times per day  . amLODipine  5 mg Oral Daily  . aspirin EC  81 mg Oral Daily  . bisacodyl  10 mg Oral Daily  . calcium-vitamin D  1 tablet Oral Q breakfast  . colesevelam  1,250 mg Oral BID WC  . fentaNYL   Intravenous 6 times per day  . irbesartan  300 mg Oral Daily   And  . hydrochlorothiazide  25 mg Oral Daily  . levothyroxine  150 mcg Oral QAC breakfast  . omega-3 acid ethyl esters  1 g Oral Daily  .  pantoprazole  40 mg Oral Daily  . senna-docusate  1 tablet Oral QHS   Continuous Infusions: . bupivacaine ON-Q pain pump    . dextrose 5 % and 0.9 % NaCl with KCl 20 mEq/L 20 mL/hr at 08/09/14 0956   PRN Meds:.diphenhydrAMINE, diphenhydrAMINE, naloxone, ondansetron (ZOFRAN) IV, ondansetron (ZOFRAN) IV, oxyCODONE, potassium chloride, sodium chloride, traMADol  General appearance: alert and cooperative Neurologic: intact Heart: regular rate and rhythm, S1, S2 normal, no murmur, click, rub or gallop Lungs: diminished breath sounds LUL Abdomen: soft, non-tender; bowel sounds normal; no masses,  no organomegaly Extremities: extremities normal, atraumatic, no cyanosis or edema and Homans sign is negative, no sign of DVT Wound: very small air leak with cough, ct on water seal  Lab Results: CBC: Recent Labs  08/09/14 0316 08/10/14 0353  WBC 5.1 5.8  HGB 10.7* 11.9*  HCT 32.1* 35.9*  PLT 207 229   BMET:  Recent Labs  08/09/14 0316 08/10/14 0353  NA 142 141  K 4.2 4.1  CL 106 102  CO2 23 26  GLUCOSE 119* 93  BUN 9 13  CREATININE 0.57 0.74  CALCIUM 8.1* 9.0    PT/INR: No results found for this basename: LABPROT, INR,  in the last 72 hours   Radiology Dg Chest Port 1 View  08/09/2014   CLINICAL DATA:  Post VATS on the left, followup  EXAM: PORTABLE CHEST - 1 VIEW  COMPARISON:  Portable chest x-ray of 08/08/2014  FINDINGS: The small left apical pneumothorax is unchanged with left chest tube remaining. Postoperative atelectasis in the left upper lung field has improved slightly. The right lung is clear. Mild cardiomegaly is stable. The right IJ central venous line tip overlies the mid SVC  IMPRESSION: 1. Improvement in left upper lobe atelectasis postoperatively. 2. No change in small left apical pneumothorax with left chest tube present.   Electronically Signed   By: Ivar Drape M.D.   On: 08/09/2014 07:57    Path results reviewed with patient, although both lesion were reported  as not malignant on frozen section the "2nd lesion" the more posterior and superior on final path reported by different pathologist as adenocarcinoma, 2.2 cm with clear margin. Nodes were negative. Considering age of patient will NOT  repeat procedure and do lobectomy.  Lung cancer,  left upper lobe   Primary site: Lung (Left)   Staging method: AJCC 7th Edition   Pathologic: Stage IA (T1b, N0, cM0) signed by Grace Isaac, MD on 08/10/2014  6:54 AM   Summary: Stage IA (T1b, N0, cM0)   Assessment/Plan: S/P Procedure(s) (LRB): VIDEO BRONCHOSCOPY (N/A) VIDEO ASSISTED THORACOSCOPY (VATS)/WEDGE RESECTION x2 with placement of mediastinal On-Q (Left) LYMPH NODE BIOPSY (Left) Mobilize d/c lines Leave chest tube today on water seal   Grace Isaac MD 08/10/2014 6:55 AM

## 2014-08-10 NOTE — Discharge Instructions (Signed)
Thoracoscopy °Care After °Refer to this sheet in the next few weeks. These discharge instructions provide you with general information on caring for yourself after you leave the hospital. Your caregiver may also give you specific instructions. Your treatment has been planned according to the most current medical practices available, but unavoidable complications sometimes occur. If you have any problems or questions after discharge, call your caregiver. °HOME CARE INSTRUCTIONS  °· Remove the bandage (dressing) over your chest tube site as directed by your caregiver. °· It is normal to be sore for a couple weeks following surgery. See your caregiver if this seems to be getting worse rather than better. °· Only take over-the-counter or prescription medicines for pain, discomfort, or fever as directed by your caregiver. It is very important to take pain medicine when you need it so that you will cough and breathe deeply enough to clear mucus (phlegm) and expand your lungs. °· If it hurts to cough, hold a pillow against your chest when you cough. This may help with the discomfort. In spite of the discomfort, cough frequently, as this helps protect against getting an infection in your lung (pneumonia). °· Taking deep breaths keeps lungs inflated and protects against pneumonia. Most patients will go home with an incentive spirometer that encourages deep breathing. °· You may resume a normal diet and activities as directed. °· Use showers for bathing until you see your caregiver, or as instructed. °· Change dressings if necessary or as directed. °· Avoid lifting or driving until you are instructed otherwise. °· Make an appointment to see your caregiver for stitch (suture) or staple removal when instructed. °· Do not travel by airplane for 2 weeks after the chest tube is removed. °SEEK MEDICAL CARE IF:  °· You are bleeding from your wounds. °· You have redness, swelling, or increasing pain in the wounds. °· Your heartbeat  feels irregular or very fast. °· There is pus coming from your wounds. °· There is a bad smell coming from the wound or dressing. °SEEK IMMEDIATE MEDICAL CARE IF:  °· You have a fever. °· You develop a rash. °· You have difficulty breathing. °· You develop any reaction or side effects to medicines given. °· You develop lightheadedness or feel faint. °· You develop shortness of breath or chest pain. °MAKE SURE YOU:  °· Understand these instructions. °· Will watch your condition. °· Will get help right away if you are not doing well or get worse. °Document Released: 05/29/2005 Document Revised: 02/01/2012 Document Reviewed: 04/29/2011 °ExitCare® Patient Information ©2015 [employer]. This information is not intended to replace advice given to you by your health care provider. Make sure you discuss any questions you have with your health care provider. ° °

## 2014-08-10 NOTE — Discharge Summary (Signed)
Physician Discharge Summary       [address]       [place] [address]             [phone]    Patient ID: Nicole Ortiz MRN: [id number] DOB/AGE: Apr 08, 1936 78 y.o.  Admit date: 08/08/2014 Discharge date: 08/14/2014  Admission Diagnoses: 1.Left upper lobe lung lesion, mildly hypermetabolic 2. 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 History of thyroid cancer   Procedure (s):  Bronchoscopy, left video-assisted thoracoscopy,  wedge resection of left upper lobe x2 and lymph node biopsy, placement of On-Q device by Dr. Servando Snare on 08/08/2014.  Pathology: Diagnosis 1. Lung, wedge biopsy/resection, left upper lobe - DENSELY INFLAMED LUNG PARENCHYMA. - THERE IS NO EVIDENCE OF MALIGNANCY. - SEE COMMENT. 2. Lung, wedge biopsy/resection, additional margin left upper lobe - BENIGN MILDLY INFLAMED LUNG PARENCHYMA WITH HEMORRHAGE. - THERE IS NO EVIDENCE OF MALIGNANCY. 3. Lung, wedge biopsy/resection, left upper lobe second nodule - ADENOCARCINOMA WITH A PREDOMINANT LEPIDIC GROWTH PATTERN, WELL DIFFERENTIATED, SPANNING 2.2 CM. - THE SURGICAL RESECTION MARGINS ARE NEGATIVE FOR ADENOCARCINOMA. - SEE ONCOLOGY TABLE BELOW. 4. Lymph node, biopsy, 10 L node - THERE IS NO EVIDENCE OF CARCINOMA IN 1 OF 1 LYMPH NODE (0/1). 5. Lymph node, biopsy, 4 L node - THERE IS NO EVIDENCE OF CARCINOMA IN 1 OF 1 LYMPH NODE (0/1). TNM Code: pT1b, pN0   History of Presenting Illness: This is a 78 y.o. female seen in the office at the request of Dr. Alcide Clever. Last year, the patient had a thyroid nodule and cyst. She had a thyroidectomy at that time. Subsequently, she was treated with I-131 for thyroid cancer (sclerosing papillary thyroid carcinoma, follicular variant, multifocal.) CT scan in December 2014 suggested a left lung mass. Follow up CT scan and PET scan was done recently and the patient was referred for  evaluation of lung mass, described as a groundglass opacity in the left lung apex 2.1 cm with an SUV of 4.0. The patient is a lifelong nonsmoker. She denies any respiratory difficulty denies hemoptysis. Dr. Servando Snare discussed the need for bronchoscopy and left VATS with wedge resection, possibly lobectomy, and lymph node dissection. Potential risks, benefits, and complications were discussed with the patient and she agreed to proceed with surgery. She underwent a bronchoscopy, left VATS, wedge of LUL x 2, lymph node dissection, and On Q placement on 08/08/2014.   Brief Hospital Course:  She remained afebrile and hemodynamically stable. A line and foley were removed early post operatively. IVF fluids were stopped, once she tolerated a diet.Daily chest x rays were obtained. There was a small left apical pneumothorax. Chest tube was placed to water seal on post op day one. Chest tube had a small air leak with cough. It was later removed on post op day 3. She has been tolerating a soft diet (she has an upper set of dentures but they were left at home). She is ambulating on room air. Chest tube was removed on 08/11/2014. She then had a forceful cough with subsequent serous drainage from the previous left chest tube wound. Chest x ray showed left apical pneumothorax, enlarged with subcutaneous emphysema. She remained on room air with oxygen saturation 92-99%. Chest x ray this am shows stable appearance of pneumothorax and interval increase in sub q air.  However, she is asymptomatic and maintaining good oxygen saturations.  She is felt surgically stable for discharge today.  Latest Vital Signs: Blood pressure 143/83, pulse 87, temperature 98.5 F (36.9 C), temperature source Oral, resp. rate 18, height 5\' 7"  (1.702 m), weight 190 lb 7.6 oz (86.4 kg), SpO2 94.00%.  Physical Exam: General appearance: alert and cooperative  Neurologic: intact  Heart: regular rate and rhythm, S1, S2 normal, no murmur, click, rub  or gallop  Lungs: diminished breath sounds LUL  Abdomen: soft, non-tender; bowel sounds normal; no masses, no organomegaly  Extremities: extremities normal, atraumatic, no cyanosis or edema and Homans sign is negative, no sign of DVT  Wound: Clean and dry. Serous drainage from left chest tube site.   Discharge Condition:Stable  Recent laboratory studies:  Lab Results  Component Value Date   WBC 5.8 08/10/2014   HGB 11.9* 08/10/2014   HCT 35.9* 08/10/2014   MCV 88.4 08/10/2014   PLT 229 08/10/2014   Lab Results  Component Value Date   NA 141 08/10/2014   K 4.1 08/10/2014   CL 102 08/10/2014   CO2 26 08/10/2014   CREATININE 0.74 08/10/2014   GLUCOSE 93 08/10/2014      Diagnostic Studies:  EXAM:  CHEST 2 VIEW  COMPARISON: 08/12/2014  FINDINGS:  Cardiomediastinal silhouette is unchanged. The patient has taken a  greater inspiration than on the prior study. Small left pneumothorax  does not appear significantly changed. Soft tissue emphysema in the  left chest wall has increased. There is improved aeration of the  left lung base. Nodular density overlying the left lung base is  unchanged and reflects a breast calcification. No new airspace  consolidation is seen. No pleural effusion is identified.  IMPRESSION:  1. Unchanged left pneumothorax.  2. Improved aeration of the left lung base.  3. Increased subcutaneous emphysema.  Electronically Signed  By: Logan Bores  On: 08/13/2014 07:46   Discharge Medications:   Medication List         amLODipine 5 MG tablet  Commonly known as:  NORVASC  Take 5 mg by mouth daily.     aspirin EC 81 MG tablet  Take 81 mg by mouth daily.     calcium-vitamin D 500-200 MG-UNIT per tablet  Commonly known as:  OSCAL WITH D  Take 1 tablet by mouth daily with breakfast.     colesevelam 625 MG tablet  Commonly known as:  WELCHOL  Take 1,250 mg by mouth 2 (two) times daily with a meal.     Fish Oil 1000 MG Caps  Take 1 capsule by mouth  daily.     furosemide 20 MG tablet  Commonly known as:  LASIX  Take 20 mg by mouth daily.     levothyroxine 150 MCG tablet  Commonly known as:  SYNTHROID, LEVOTHROID  Take 150 mcg by mouth daily before breakfast.     oxyCODONE 5 MG immediate release tablet  Commonly known as:  Oxy IR/ROXICODONE  Take 1-2 tablets (5-10 mg total) by mouth every 6 (six) hours as needed for severe pain.     telmisartan-hydrochlorothiazide 80-25 MG per tablet  Commonly known as:  MICARDIS HCT  Take 1 tablet by mouth daily.        Follow Up Appointments: Follow-up Information   Follow up with Grace Isaac, MD On 08/23/2014. (Appointment is at 10:30)    Specialty:  Cardiothoracic Surgery   Contact information:   [address] [place] [address] [phone]       Follow up with [facility] On 08/23/2014. (Please get CXR at 9:30,  office is located on first floor of [facility])    Contact information:   [facility]       SignedCinda Quest 08/14/2014, 9:41 AM

## 2014-08-11 ENCOUNTER — Inpatient Hospital Stay (HOSPITAL_COMMUNITY): Payer: Medicare PPO

## 2014-08-11 LAB — TISSUE CULTURE
Culture: NO GROWTH
Culture: NO GROWTH
Special Requests: POSITIVE

## 2014-08-11 NOTE — Progress Notes (Addendum)
       [address]       [place] [address]             [phone]          3 Days Post-Op Procedure(s) (LRB): VIDEO BRONCHOSCOPY (N/A) VIDEO ASSISTED THORACOSCOPY (VATS)/WEDGE RESECTION x2 with placement of mediastinal On-Q (Left) LYMPH NODE BIOPSY (Left)  Subjective: Comfortable, breathing stable.   Objective: Vital signs in last 24 hours: Patient Vitals for the past 24 hrs:  BP Temp Temp src Pulse Resp SpO2 Weight  08/11/14 0609 138/77 mmHg 98.1 F (36.7 C) Oral 76 17 94 % 181 lb 11.2 oz (82.419 kg)  08/10/14 2036 145/77 mmHg 98.8 F (37.1 C) Oral 86 19 96 % -  08/10/14 1700 139/80 mmHg 98.5 F (36.9 C) Oral 80 20 97 % -   Current Weight  08/11/14 181 lb 11.2 oz (82.419 kg)     Intake/Output from previous day: 09/18 0701 - 09/19 0700 In: 480 [P.O.:480] Out: 590 [Urine:550; Chest Tube:40]    PHYSICAL EXAM:  Heart: RRR Lungs:Clear Wound: Clean and dry Chest tube: No air leak, but some tidaling in column    Lab Results: CBC: Recent Labs  08/09/14 0316 08/10/14 0353  WBC 5.1 5.8  HGB 10.7* 11.9*  HCT 32.1* 35.9*  PLT 207 229   BMET:  Recent Labs  08/09/14 0316 08/10/14 0353  NA 142 141  K 4.2 4.1  CL 106 102  CO2 23 26  GLUCOSE 119* 93  BUN 9 13  CREATININE 0.57 0.74  CALCIUM 8.1* 9.0    PT/INR: No results found for this basename: LABPROT, INR,  in the last 72 hours    Assessment/Plan: S/P Procedure(s) (LRB): VIDEO BRONCHOSCOPY (N/A) VIDEO ASSISTED THORACOSCOPY (VATS)/WEDGE RESECTION x2 with placement of mediastinal On-Q (Left) LYMPH NODE BIOPSY (Left) CXR not read yet and unable to view as PACS system is down.  Will follow up on CXR.  Possibly could d/c CT today. Continue ambulation, pulm toilet.    LOS: 3 days    COLLINS,GINA H 08/11/2014  Patient seen and examined, agree with above No air leak, CXR stable with a small triangular apical space- dc tube

## 2014-08-11 NOTE — Progress Notes (Signed)
dc'ed ct and on Q pt tolerated well

## 2014-08-12 ENCOUNTER — Inpatient Hospital Stay (HOSPITAL_COMMUNITY): Payer: Medicare PPO

## 2014-08-12 NOTE — Progress Notes (Addendum)
       [address]       [place] [address]             [phone]          4 Days Post-Op Procedure(s) (LRB): VIDEO BRONCHOSCOPY (N/A) VIDEO ASSISTED THORACOSCOPY (VATS)/WEDGE RESECTION x2 with placement of mediastinal On-Q (Left) LYMPH NODE BIOPSY (Left)  Subjective: Comfortable, breathing stable. Had a forceful cough earlier with subsequent serous drainage from CT site.    Objective: Vital signs in last 24 hours: Patient Vitals for the past 24 hrs:  BP Temp Temp src Pulse Resp SpO2 Weight  08/12/14 0417 139/80 mmHg 98.5 F (36.9 C) Oral 89 18 94 % 181 lb 4.8 oz (82.237 kg)  08/11/14 2017 132/68 mmHg 98.2 F (36.8 C) Oral 79 18 98 % -  08/11/14 1433 111/66 mmHg - - 77 20 97 % -   Current Weight  08/12/14 181 lb 4.8 oz (82.237 kg)     Intake/Output from previous day: 09/19 0701 - 09/20 0700 In: 720 [P.O.:720] Out: -     PHYSICAL EXAM:  Heart: RRR Lungs: Clear Wound: Intact and dry, , serous drainage on dressing from CT site but no active drainage, small amount surrounding subQ air     Lab Results: CBC: Recent Labs  08/10/14 0353  WBC 5.8  HGB 11.9*  HCT 35.9*  PLT 229   BMET:  Recent Labs  08/10/14 0353  NA 141  K 4.1  CL 102  CO2 26  GLUCOSE 93  BUN 13  CREATININE 0.74  CALCIUM 9.0    PT/INR: No results found for this basename: LABPROT, INR,  in the last 72 hours  CXR: FINDINGS:  Interval removal of the left chest tube. Increased size of the  previously demonstrated left apical pneumothorax, currently  occupying approximately 25% of the volume of the left hemothorax.  This includes a small medial component. No mediastinal shift.  Interval subcutaneous emphysema. Interval predominantly linear  density at the left lung base. The cardiac silhouette remains  borderline enlarged. The interstitial markings remain mildly  prominent. Unremarkable bones.  IMPRESSION:  1. Increased size of the left pneumothorax following  left chest tube  removal, currently occupying approximately 25% of the volume of the  left hemithorax.  2. Interval subcutaneous emphysema on the left.  3. Interval left basilar atelectasis   Assessment/Plan: S/P Procedure(s) (LRB): VIDEO BRONCHOSCOPY (N/A) VIDEO ASSISTED THORACOSCOPY (VATS)/WEDGE RESECTION x2 with placement of mediastinal On-Q (Left) LYMPH NODE BIOPSY (Left) I have reviewed her x-ray this am with Dr. Roxan Hockey and we will plan to keep her in the hospital today and monitor.  She has had a small apical space that has now enlarged following CT removal, but she is asymptomatic at present. Repeat CXR in am.  Hopefully this will improve and we can avoid replacing her chest tube.   LOS: 4 days    COLLINS,GINA H 08/12/2014  Patient seen and examined, agree with above I suspect it may just be a space, but with subcutaneous emphysema there is a possibility that it is a true pneumo. She is not in any distress. Will recheck CXR in AM

## 2014-08-13 ENCOUNTER — Inpatient Hospital Stay (HOSPITAL_COMMUNITY): Payer: Medicare PPO

## 2014-08-13 MED ORDER — OXYCODONE HCL 5 MG PO TABS: 5.0000 mg | ORAL_TABLET | Freq: Four times a day (QID) | ORAL | Status: DC | PRN

## 2014-08-13 NOTE — Progress Notes (Addendum)
      [address]       [place] [address]             [phone]       5 Days Post-Op Procedure(s) (LRB): VIDEO BRONCHOSCOPY (N/A) VIDEO ASSISTED THORACOSCOPY (VATS)/WEDGE RESECTION x2 with placement of mediastinal On-Q (Left) LYMPH NODE BIOPSY (Left)  Subjective: Patient slept well. Drainage decreasing from previous chest tube site. Denies any breathing difficulties.  Objective: Vital signs in last 24 hours: Temp:  [98.1 F (36.7 C)-99.3 F (37.4 C)] 98.6 F (37 C) (09/21 0345) Pulse Rate:  [76-88] 88 (09/21 0345) Cardiac Rhythm:  [-] Normal sinus rhythm (09/20 2005) Resp:  [18-20] 20 (09/21 0345) BP: (127-130)/(56-72) 130/72 mmHg (09/21 0345) SpO2:  [92 %-99 %] 92 % (09/21 0345)     Intake/Output from previous day: 09/20 0701 - 09/21 0700 In: 720 [P.O.:720] Out: -    Physical Exam:  Cardiovascular: RRR Pulmonary: Clear to auscultation bilaterally; no rales, wheezes, or rhonchi. Abdomen: Soft, non tender, bowel sounds present. Wounds: Clean and dry.  No erythema or signs of infection. Chest tube site with serous drainage from left corner.  Lab Results: CBC:No results found for this basename: WBC, HGB, HCT, PLT,  in the last 72 hours BMET: No results found for this basename: NA, K, CL, CO2, GLUCOSE, BUN, CREATININE, CALCIUM,  in the last 72 hours  PT/INR: No results found for this basename: LABPROT, INR,  in the last 72 hours ABG:  INR: Will add last result for INR, ABG once components are confirmed Will add last 4 CBG results once components are confirmed  Assessment/Plan:  1. CV - SR in the 80's. 2.  Pulmonary - CXR this am appears to show left apical pneumothorax (possibly decreased), increased subcutaneous emphysema left lateral chest wall. 3. Will discuss disposition with surgeon.  Ortiz,Nicole MPA-C 08/13/2014,7:36 AM pateint feels well, ambulating, some increase in subq air on film today will wait on d/c until tomorrow and  repeat chest xray in am I have seen and examined Nicole Ortiz and agree with the above assessment  and plan.  Grace Isaac MD Beeper [phone] Office [phone] 08/13/2014 1:34 PM

## 2014-08-14 ENCOUNTER — Inpatient Hospital Stay (HOSPITAL_COMMUNITY): Payer: Medicare PPO

## 2014-08-14 NOTE — Progress Notes (Signed)
      [address]       [place] [address]             [phone]      6 Days Post-Op Procedure(s) (LRB): VIDEO BRONCHOSCOPY (N/A) VIDEO ASSISTED THORACOSCOPY (VATS)/WEDGE RESECTION x2 with placement of mediastinal On-Q (Left) LYMPH NODE BIOPSY (Left)  Subjective:  Ms. Binney has no new complaints this morning.  Denies shortness of breath.  Objective: Vital signs in last 24 hours: Temp:  [98.5 F (36.9 C)-99.5 F (37.5 C)] 98.5 F (36.9 C) (09/22 0703) Pulse Rate:  [87] 87 (09/22 0703) Cardiac Rhythm:  [-] Normal sinus rhythm (09/22 0740) Resp:  [18] 18 (09/22 0703) BP: (128-143)/(66-83) 143/83 mmHg (09/22 0703) SpO2:  [94 %-95 %] 94 % (09/22 0703) Weight:  [190 lb 7.6 oz (86.4 kg)] 190 lb 7.6 oz (86.4 kg) (09/22 0703)  Intake/Output from previous day: 09/21 0701 - 09/22 0700 In: 240 [P.O.:240] Out: -   General appearance: alert, cooperative and no distress Heart: regular rate and rhythm Lungs: clear to auscultation bilaterally Abdomen: soft, non-tender; bowel sounds normal; no masses,  no organomegaly Wound: clean and dry  Lab Results: No results found for this basename: WBC, HGB, HCT, PLT,  in the last 72 hours BMET: No results found for this basename: NA, K, CL, CO2, GLUCOSE, BUN, CREATININE, CALCIUM,  in the last 72 hours  PT/INR: No results found for this basename: LABPROT, INR,  in the last 72 hours ABG    Component Value Date/Time   PHART 7.389 08/09/2014 0417   HCO3 25.6* 08/09/2014 0417   TCO2 27 08/09/2014 0417   O2SAT 94.0 08/09/2014 0417   CBG (last 3)  No results found for this basename: GLUCAP,  in the last 72 hours  Assessment/Plan: S/P Procedure(s) (LRB): VIDEO BRONCHOSCOPY (N/A) VIDEO ASSISTED THORACOSCOPY (VATS)/WEDGE RESECTION x2 with placement of mediastinal On-Q (Left) LYMPH NODE BIOPSY (Left)  1. CXR with stable appearance of pneumothorax, there is some increase in sub-q air, however patient is asymptomatic 2. CV-  remains Hypertensive will resume home medication regimen 3. Dispo- patient stable for discharge, will plan to follow up with in office in 1 week with a CXR   LOS: 6 days    Ahmed Prima, Junie Panning 08/14/2014

## 2014-08-14 NOTE — Progress Notes (Signed)
08/14/2014 11:15 AM Nursing note Discharge avs form, medications already taken today and those due this evening given and reviewed with patient. rx given and reviewed. Follow up appointments, incision site care including dressing changes and when to call MD reviewed. Questions and concerns addressed. D/c iv. D/c tele. D/c home per orders.  Josh Nicolosi, Arville Lime

## 2014-08-14 NOTE — Care Management Note (Signed)
    Page 1 of 1   08/14/2014     3:21:49 PM CARE MANAGEMENT NOTE 08/14/2014  Patient:  Nicole Ortiz, Nicole Ortiz   Account Number:  [id number]  Date Initiated:  08/13/2014  Documentation initiated by:  Eleanna Theilen  Subjective/Objective Assessment:   Pt s/p VATS with wedge resection on 08/08/14.  PTA, pt independent, lives with spouse.     Action/Plan:   Pt denies any needs for home; likely dc on 08/14/14.   Anticipated DC Date:  08/14/2014   Anticipated DC Plan:  [facility]  CM consult      Choice offered to / List presented to:             Status of service:  Completed, signed off Medicare Important Message given?  YES (If response is "NO", the following Medicare IM given date fields will be blank) Date Medicare IM given:  08/13/2014 Medicare IM given by:  Jacklynn Dehaas Date Additional Medicare IM given:   Additional Medicare IM given by:    Discharge Disposition:  HOME/SELF CARE  Per UR Regulation:  Reviewed for med. necessity/level of care/duration of stay  If discussed at [place] of Stay Meetings, dates discussed:   08/14/2014    Comments:

## 2014-08-22 ENCOUNTER — Other Ambulatory Visit: Payer: Self-pay | Admitting: Cardiothoracic Surgery

## 2014-08-22 DIAGNOSIS — C3410 Malignant neoplasm of upper lobe, unspecified bronchus or lung: Secondary | ICD-10-CM

## 2014-08-22 NOTE — Progress Notes (Signed)
[address]       [place] [address]             [phone]      Nahdia S Adames [place] Medical Record #[id number] Date of Birth: May 08, 1936  Referring: Gardiner Rhyme, MD Primary Care: Alma Friendly, MD  Chief Complaint:   POST OP FOLLOW UP 08/08/2014  OPERATIVE REPORT  PROCEDURES PERFORMED: Bronchoscopy, left video-assisted thoracoscopy,  wedge resection of left upper lobe x2 and lymph node biopsy, placement  of On-Q device.  SURGEON: Lanelle Bal, MD  Lung cancer,  left upper lobe   Primary site: Lung (Left)   Staging method: AJCC 7th Edition   Pathologic: Stage IA (T1b, N0, cM0) signed by Grace Isaac, MD on 08/10/2014  6:54 AM   Summary: Stage IA (T1b, N0, cM0)  History of Present Illness:     Patient returns after recent lung resection, initially on frozen section was thought to be inflammatory. 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 Since discharge the patient had noted some increased subcutaneous air in her neck that has subsequently resolved. She's had no shortness of breath or cough. She has noted some redness around the chest tube site. She's had no fever chills.     Past Medical History  Diagnosis Date  . Hypertension   . Hyperlipidemia   . Arthritis   . H/O: hysterectomy   . Lesion of left lung 11/22/13    left upper lobe  . Cancer     thyroid  . Shortness of breath     at times  . Hypothyroidism   . CAD (coronary artery disease)     with stent placement. Pt denies CAD or stent placement; had coronary calcifications on CT-had no ischemia, NL LVEF on stress echo 12/28/13 (Dr. Otho Perl; [facility])     History  Smoking status  . Never Smoker   Smokeless tobacco  . Never Used    History  Alcohol Use No     Allergies  Allergen Reactions  . Chlorthalidone Cough  . Lipitor [Atorvastatin]     Muscle weakness  . Zocor [Simvastatin]     Muscle weakness     Current  Outpatient Prescriptions  Medication Sig Dispense Refill  . amLODipine (NORVASC) 5 MG tablet Take 5 mg by mouth daily.      Marland Kitchen aspirin EC 81 MG tablet Take 81 mg by mouth daily.      . calcium-vitamin D (OSCAL WITH D) 500-200 MG-UNIT per tablet Take 1 tablet by mouth daily with breakfast.      . colesevelam (WELCHOL) 625 MG tablet Take 1,250 mg by mouth 2 (two) times daily with a meal.       . furosemide (LASIX) 20 MG tablet Take 20 mg by mouth daily.      Marland Kitchen levothyroxine (SYNTHROID, LEVOTHROID) 150 MCG tablet Take 150 mcg by mouth daily before breakfast.      . Omega-3 Fatty Acids (FISH OIL) 1000 MG CAPS Take 1 capsule by mouth daily.      Marland Kitchen telmisartan-hydrochlorothiazide (MICARDIS HCT) 80-25 MG per tablet Take 1 tablet by mouth daily.      Marland Kitchen oxyCODONE (OXY IR/ROXICODONE) 5 MG immediate release tablet Take 1-2 tablets (5-10 mg total) by mouth every 6 (six) hours as needed for severe pain.  30 tablet  0   No current facility-administered medications for this visit.       Physical  Exam: BP 117/82  Pulse 93  Wt 190 lb (86.183 kg)  SpO2 98%  General appearance: alert and cooperative Neurologic: intact Heart: regular rate and rhythm, S1, S2 normal, no murmur, click, rub or gallop Lungs: clear to auscultation bilaterally Abdomen: soft, non-tender; bowel sounds normal; no masses,  no organomegaly Extremities: extremities normal, atraumatic, no cyanosis or edema and Homans sign is negative, no sign of DVT Wound: Patient's left chest incision is healing well, the chest tube sutures are still in place and the chest tube site has some surrounding erythema   Diagnostic Studies & Laboratory data:     Recent Radiology Findings:   Dg Chest 2 View  08/23/2014   CLINICAL DATA:  Recent lung carcinoma left upper lobe, status post left upper lobectomy 2 weeks prior. Currently with chest pain  EXAM: CHEST  2 VIEW  COMPARISON:  August 14, 2014  FINDINGS: Postoperative changes again noted on the  left. Left apical pneumothorax is smaller compared to most recent prior study. There is extensive subcutaneous air on the left, less than on most recent prior study.  There is scarring in the left upper lobe. There is a calcified granuloma in the left base. Right lung is clear. Heart size and pulmonary vascularity are normal. No adenopathy. There is atherosclerotic change in the aorta. There are no blastic or lytic bone lesions.  IMPRESSION: Postoperative change on the left. Left apical pneumothorax is smaller compared to most recent prior study. There is less subcutaneous emphysema on the left compared to recent prior study. Scarring left upper lobe. Granuloma left base. No edema or consolidation. Right lung clear.   Electronically Signed   By: Lowella Grip M.D.   On: 08/23/2014 10:14     PATH Findings: Diagnosis 1. Lung, wedge biopsy/resection, left upper lobe - DENSELY INFLAMED LUNG PARENCHYMA. - THERE IS NO EVIDENCE OF MALIGNANCY. - SEE COMMENT. 2. Lung, wedge biopsy/resection, additional margin left upper lobe - BENIGN MILDLY INFLAMED LUNG PARENCHYMA WITH HEMORRHAGE. - THERE IS NO EVIDENCE OF MALIGNANCY. 3. Lung, wedge biopsy/resection, left upper lobe second nodule - ADENOCARCINOMA WITH A PREDOMINANT LEPIDIC GROWTH PATTERN, WELL DIFFERENTIATED, SPANNING 2.2 CM. - THE SURGICAL RESECTION MARGINS ARE NEGATIVE FOR ADENOCARCINOMA. - SEE ONCOLOGY TABLE BELOW. 4. Lymph node, biopsy, 10 L node - THERE IS NO EVIDENCE OF CARCINOMA IN 1 OF 1 LYMPH NODE (0/1). 5. Lymph node, biopsy, 4 L node - THERE IS NO EVIDENCE OF CARCINOMA IN 1 OF 1 LYMPH NODE (0/1). Microscopic Comment 1. The histologic features may be seen in a resolving pneumonia/pneumonitis. Clinical correlation is recommended. Nonetheless, malignant features are not identified. Dr. Claudette Laws has reviewed selected slides of part 1 and concurs that this is a benign lesion. 3. LUNG Specimen, including laterality: Left upper  lobe Procedure: Wedge resection Specimen integrity (intact/disrupted): Previously incised Tumor site: Left upper lobe ("second nodule") Tumor focality: Unifocal Maximum tumor size (cm): 2.2 cm (gross measurement) Histologic type: Adenocarcinoma with a predominant lepidic ("in situ") growth pattern Grade: Well differentiated. Margins: Negative for adenocarcinoma Distance to closest margin (cm): At least 0.5 cm Visceral pleura invasion: Not identified 1 of 3 FINAL for WINSLOW, VERRILL ([id number]) Microscopic Comment(continued) Tumor extension: Confined to lung parenchyma Treatment effect (if treated with neoadjuvant therapy): N/A Lymph -Vascular invasion: Not identified Lymph nodes: Number examined - 2; Number N1 nodes positive 0; Number N2 nodes positive 0 TNM code: pT1b, pN0 Ancillary studies: Additional studies can be performed at the clinician's request Non-neoplastic lung: See parts #1  and #2. Comments: Dr. Claudette Laws has reviewed selected slides from part 3 and concurs that this is a malignant lesion. (JBK:kh 08-09-14) Enid Cutter MD Pathologist, Electronic Signature (Case signed 08/09/2014)  Recent Lab Findings: Lab Results  Component Value Date   WBC 5.8 08/10/2014   HGB 11.9* 08/10/2014   HCT 35.9* 08/10/2014   PLT 229 08/10/2014   GLUCOSE 93 08/10/2014   ALT 10 08/10/2014   AST 19 08/10/2014   NA 141 08/10/2014   K 4.1 08/10/2014   CL 102 08/10/2014   CREATININE 0.74 08/10/2014   BUN 13 08/10/2014   CO2 26 08/10/2014   INR 0.97 08/06/2014      Assessment / Plan:     Adenocarcinoma with a predominant lepidic ("in situ") growth pattern, left upper lobe pStage IA resected  Patient is doing well after surgical resection, chest x-ray is improving, subcutaneous air is resolving I started the patient on Keflex 500 mg 3 times a day for 7 days because of erythema around the chest tube site, this may just be related to the sutures/irritation. The chest tube sutures have been  removed She will return in 3 weeks with a followup chest x-ray.      Grace Isaac MD      [address] [place] [address] Office [phone]   Beeper [phone]  08/23/2014 10:35 AM

## 2014-08-22 NOTE — Patient Instructions (Signed)
Lung Resection, Care After Refer to this sheet in the next few weeks. These instructions provide you with information on caring for yourself after your procedure. Your health care provider may also give you more specific instructions. Your treatment has been planned according to current medical practices, but problems sometimes occur. Call your health care provider if you have any problems or questions after your procedure. WHAT TO EXPECT AFTER THE PROCEDURE After your procedure, it is typical to have the following:   You may feel pain in your chest and throat.  Patients may sometimes shiver or feel nauseous during recovery. HOME CARE INSTRUCTIONS  You may resume a normal diet and activities as directed by your health care provider.  Do not use any tobacco products, including cigarettes, chewing tobacco, or electronic cigarettes. If you need help quitting, ask your health care provider.  There are many different ways to close and cover an incision, including stitches, skin glue, and adhesive strips. Follow your health care provider's instructions on:  Incision care.  Bandage (dressing) changes and removal.  Incision closure removal.  Take medicines only as directed by your health care provider.  Keep all follow-up visits as directed by your health care provider. This is important.  Try to breathe deeply and cough as directed. Holding a pillow firmly over your ribs may help with discomfort.  If you were given an incentive spirometer in the hospital, continue to use it as directed by your health care provider.  Walk as directed by your health care provider.  You may take a shower and gently wash the area of your incision with water and soap as directed by your health care provider. Do not use anything else to clean your incision except as directed by your health care provider. Do not take baths, swim, or use a hot tub until your health care provider approves. SEEK MEDICAL CARE  IF:  You notice redness, swelling, or increasing pain at the incision site.  You are bleeding at the incision site.  You see pus coming from the incision site.  You notice a bad smell coming from the incision site or bandage.  Your incision breaks open.  You cough up blood or pus, or you develop a cough that produces bad-smelling sputum.  You have pain or swelling in your legs.  You have increasing pain that is not controlled with medicine.  You have trouble managing any of the tubes that have been left in place after surgery.  You have fever or chills. SEEK IMMEDIATE MEDICAL CARE IF:   You have chest pain or an irregular or rapid heartbeat.  You have dizzy episodes or faint.  You have shortness of breath or difficulty breathing.  You have persistent nausea or vomiting.  You have a rash. Document Released: 05/29/2005 Document Revised: 03/26/2014 Document Reviewed: 12/29/2013 [facility] Patient Information 2015 [place], [place]. This information is not intended to replace advice given to you by your health care provider. Make sure you discuss any questions you have with your health care provider.

## 2014-08-23 ENCOUNTER — Encounter: Payer: Self-pay | Admitting: Cardiothoracic Surgery

## 2014-08-23 ENCOUNTER — Ambulatory Visit (INDEPENDENT_AMBULATORY_CARE_PROVIDER_SITE_OTHER): Payer: Self-pay | Admitting: Cardiothoracic Surgery

## 2014-08-23 ENCOUNTER — Ambulatory Visit
Admission: RE | Admit: 2014-08-23 | Discharge: 2014-08-23 | Disposition: A | Discharge status: Home / Self Care | Payer: Medicare PPO | Source: Ambulatory Visit | Attending: Cardiothoracic Surgery | Admitting: Cardiothoracic Surgery

## 2014-08-23 VITALS — BP 117/82 | HR 93 | Wt 190.0 lb

## 2014-08-23 DIAGNOSIS — C3412 Malignant neoplasm of upper lobe, left bronchus or lung: Secondary | ICD-10-CM

## 2014-08-23 DIAGNOSIS — C3410 Malignant neoplasm of upper lobe, unspecified bronchus or lung: Secondary | ICD-10-CM

## 2014-08-23 MED ORDER — CEPHALEXIN 500 MG PO CAPS: 500.0000 mg | ORAL_CAPSULE | Freq: Three times a day (TID) | ORAL | Status: DC

## 2014-09-05 LAB — FUNGUS CULTURE W SMEAR
Fungal Smear: NONE SEEN
Fungal Smear: NONE SEEN

## 2014-09-10 ENCOUNTER — Ambulatory Visit: Payer: Medicare PPO

## 2014-09-13 ENCOUNTER — Ambulatory Visit: Payer: Medicare PPO

## 2014-09-14 ENCOUNTER — Other Ambulatory Visit: Payer: Self-pay | Admitting: Cardiothoracic Surgery

## 2014-09-14 DIAGNOSIS — C3412 Malignant neoplasm of upper lobe, left bronchus or lung: Secondary | ICD-10-CM

## 2014-09-17 ENCOUNTER — Ambulatory Visit (INDEPENDENT_AMBULATORY_CARE_PROVIDER_SITE_OTHER): Payer: Self-pay | Admitting: Surgical

## 2014-09-17 ENCOUNTER — Ambulatory Visit
Admission: RE | Admit: 2014-09-17 | Discharge: 2014-09-17 | Disposition: A | Discharge status: Home / Self Care | Payer: Medicare PPO | Source: Ambulatory Visit | Attending: Cardiothoracic Surgery | Admitting: Cardiothoracic Surgery

## 2014-09-17 VITALS — BP 131/83 | HR 80 | Ht 67.0 in | Wt 190.0 lb

## 2014-09-17 DIAGNOSIS — C3412 Malignant neoplasm of upper lobe, left bronchus or lung: Secondary | ICD-10-CM

## 2014-09-17 NOTE — Patient Instructions (Signed)
Continue routine post-op  rehab modalities

## 2014-09-17 NOTE — Progress Notes (Signed)
[address]       [place] [address]             [phone]      Macaria S Barbee [place] Medical Record #[id number] Date of Birth: October 06, 1936  Referring: Gardiner Rhyme, MD Primary Care: Alma Friendly, MD  Chief Complaint:   POST OP FOLLOW UP 08/08/2014  OPERATIVE REPORT  PREOPERATIVE DIAGNOSIS: Left upper lobe lung lesion, mildly  hypermetabolic.  POSTOPERATIVE DIAGNOSIS: Inflammatory nodules, left upper lung x2. No  evidence of malignancy.  PROCEDURES PERFORMED: Bronchoscopy, left video-assisted thoracoscopy,  wedge resection of left upper lobe x2 and lymph node biopsy, placement  of On-Q device.  SURGEON: Lanelle Bal, MD  History of Present Illness:     Lung cancer,  left upper lobe   Primary site: Lung (Left)   Staging method: AJCC 7th Edition   Pathologic: Stage IA (T1b, N0, cM0) signed by Grace Isaac, MD on 08/10/2014  6:54 AM   Summary: Stage IA (T1b, N0, cM0)     Past Medical History  Diagnosis Date  . Hypertension   . Hyperlipidemia   . Arthritis   . H/O: hysterectomy   . Lesion of left lung 11/22/13    left upper lobe  . Cancer     thyroid  . Shortness of breath     at times  . Hypothyroidism   . CAD (coronary artery disease)     with stent placement. Pt denies CAD or stent placement; had coronary calcifications on CT-had no ischemia, NL LVEF on stress echo 12/28/13 (Dr. Otho Perl; [facility])     History  Smoking status  . Never Smoker   Smokeless tobacco  . Never Used    History  Alcohol Use No     Allergies  Allergen Reactions  . Chlorthalidone Cough  . Lipitor [Atorvastatin]     Muscle weakness  . Zocor [Simvastatin]     Muscle weakness     Current Outpatient Prescriptions  Medication Sig Dispense Refill  . amLODipine (NORVASC) 5 MG tablet Take 5 mg by mouth daily.      Marland Kitchen aspirin EC 81 MG tablet Take 81 mg by mouth daily.      . calcium-vitamin D (OSCAL WITH D) 500-200 MG-UNIT per tablet Take 1  tablet by mouth daily with breakfast.      . colesevelam (WELCHOL) 625 MG tablet Take 1,250 mg by mouth 2 (two) times daily with a meal.       . furosemide (LASIX) 20 MG tablet Take 20 mg by mouth daily.      Marland Kitchen levothyroxine (SYNTHROID, LEVOTHROID) 150 MCG tablet Take 150 mcg by mouth daily before breakfast.      . Omega-3 Fatty Acids (FISH OIL) 1000 MG CAPS Take 1 capsule by mouth daily.      Marland Kitchen telmisartan-hydrochlorothiazide (MICARDIS HCT) 80-25 MG per tablet Take 1 tablet by mouth daily.      Marland Kitchen oxyCODONE (OXY IR/ROXICODONE) 5 MG immediate release tablet Take 1-2 tablets (5-10 mg total) by mouth every 6 (six) hours as needed for severe pain.  30 tablet  0   No current facility-administered medications for this visit.       Physical Exam: BP 131/83  Pulse 80  Ht 5\' 7"  (1.702 m)  Wt 190 lb (86.183 kg)  BMI 29.75 kg/m2  SpO2 97%  General appearance: alert, cooperative and no distress Heart: regular rate and rhythm, S1, S2 normal, no murmur, click, rub or  gallop Lungs: clear to auscultation bilaterally Abdomen: benign exam Extremities: minor lower ext edema Wound: healing well without evidence of infection   Diagnostic Studies & Laboratory data:     Recent Radiology Findings:   Dg Chest 2 View  09/17/2014   CLINICAL DATA:  History of lung carcinoma; status post surgery approximately 1 month prior  EXAM: CHEST  2 VIEW  COMPARISON:  August 23, 2014  FINDINGS: Subcutaneous emphysema and left-sided pneumothorax have resolved in the interval since the prior study. There is scarring in the left upper lobe with lateral left apical pleural thickening consistent with the postoperative change. There is a small calcified granuloma in the extreme anterior left lung base region near the chest wall. Lungs elsewhere are clear. Heart size is normal. Pulmonary vascularity is within normal limits given the underlying postoperative change on the left. There is no adenopathy apparent. There is  atherosclerotic change in the aorta. There is anterior wedging of a mid thoracic vertebral body, stable.  IMPRESSION: Postoperative scarring left upper lobe. Interval resolution of left apical pneumothorax and left sided subcutaneous air. No new opacity. No change in cardiac silhouette.   Electronically Signed   By: Lowella Grip M.D.   On: 09/17/2014 13:23      Recent Lab Findings: Lab Results  Component Value Date   WBC 5.8 08/10/2014   HGB 11.9* 08/10/2014   HCT 35.9* 08/10/2014   PLT 229 08/10/2014   GLUCOSE 93 08/10/2014   ALT 10 08/10/2014   AST 19 08/10/2014   NA 141 08/10/2014   K 4.1 08/10/2014   CL 102 08/10/2014   CREATININE 0.74 08/10/2014   BUN 13 08/10/2014   CO2 26 08/10/2014   INR 0.97 08/06/2014      Assessment / Plan:  Doing well with steady recovery.    Plan to repeat CXR in 3 months and CT scan in 6 months          Grace Isaac MD      [address] [place] [address] Office [phone]   Beeper [phone]  09/17/2014 1:58 PM

## 2014-09-20 LAB — AFB CULTURE WITH SMEAR (NOT AT ARMC)
Acid Fast Smear: NONE SEEN
Acid Fast Smear: NONE SEEN

## 2014-12-18 ENCOUNTER — Other Ambulatory Visit: Payer: Self-pay | Admitting: Cardiothoracic Surgery

## 2014-12-18 DIAGNOSIS — C3412 Malignant neoplasm of upper lobe, left bronchus or lung: Secondary | ICD-10-CM

## 2014-12-20 ENCOUNTER — Ambulatory Visit: Payer: Medicare PPO | Admitting: Cardiothoracic Surgery

## 2014-12-20 ENCOUNTER — Ambulatory Visit (INDEPENDENT_AMBULATORY_CARE_PROVIDER_SITE_OTHER): Payer: Medicare PPO | Admitting: Cardiothoracic Surgery

## 2014-12-20 ENCOUNTER — Encounter: Payer: Self-pay | Admitting: Cardiothoracic Surgery

## 2014-12-20 ENCOUNTER — Ambulatory Visit
Admission: RE | Admit: 2014-12-20 | Discharge: 2014-12-20 | Disposition: A | Discharge status: Home / Self Care | Payer: Medicare PPO | Source: Ambulatory Visit | Attending: Cardiothoracic Surgery | Admitting: Cardiothoracic Surgery

## 2014-12-20 VITALS — BP 152/91 | HR 79 | Resp 16 | Ht 66.0 in | Wt 180.0 lb

## 2014-12-20 DIAGNOSIS — C3412 Malignant neoplasm of upper lobe, left bronchus or lung: Secondary | ICD-10-CM

## 2014-12-20 DIAGNOSIS — Z9889 Other specified postprocedural states: Secondary | ICD-10-CM

## 2014-12-20 NOTE — Progress Notes (Signed)
[address]       [place] [address]             [phone]      Latacha S Lombardo [place] Medical Record #[id number] Date of Birth: 19-Sep-1936  Referring: Gardiner Rhyme, MD Primary Care: Alma Friendly, MD  Chief Complaint:   POST OP FOLLOW UP 08/08/2014  OPERATIVE REPORT  PREOPERATIVE DIAGNOSIS: Left upper lobe lung lesion, mildly  hypermetabolic.  POSTOPERATIVE DIAGNOSIS: Inflammatory nodules, left upper lung x2. No  evidence of malignancy.  PROCEDURES PERFORMED: Bronchoscopy, left video-assisted thoracoscopy,  wedge resection of left upper lobe x2 and lymph node biopsy, placement  of On-Q device.  SURGEON: Lanelle Bal, MD  History of Present Illness:     Lung cancer,  left upper lobe   Primary site: Lung (Left)   Staging method: AJCC 7th Edition   Pathologic: Stage IA (T1b, N0, cM0) signed by Grace Isaac, MD on 08/10/2014  6:54 AM   Summary: Stage IA (T1b, N0, cM0) Diagnosis 1. Lung, wedge biopsy/resection, left upper lobe - DENSELY INFLAMED LUNG PARENCHYMA. - THERE IS NO EVIDENCE OF MALIGNANCY. - SEE COMMENT. 2. Lung, wedge biopsy/resection, additional margin left upper lobe - BENIGN MILDLY INFLAMED LUNG PARENCHYMA WITH HEMORRHAGE. - THERE IS NO EVIDENCE OF MALIGNANCY. 3. Lung, wedge biopsy/resection, left upper lobe second nodule - ADENOCARCINOMA WITH A PREDOMINANT LEPIDIC GROWTH PATTERN, WELL DIFFERENTIATED, SPANNING 2.2 CM. - THE SURGICAL RESECTION MARGINS ARE NEGATIVE FOR ADENOCARCINOMA. - SEE ONCOLOGY TABLE BELOW. 4. Lymph node, biopsy, 10 L node - THERE IS NO EVIDENCE OF CARCINOMA IN 1 OF 1 LYMPH NODE (0/1). 5. Lymph node, biopsy, 4 L node - THERE IS NO EVIDENCE OF CARCINOMA IN 1 OF 1 LYMPH NODE (0/1). Microscopic Comment 1. The histologic features may be seen in a resolving pneumonia/pneumonitis. Clinical correlation is recommended. Nonetheless, malignant features are not identified. Dr. Claudette Laws has reviewed  selected slides of part 1 and concurs that this is a benign lesion. 3. LUNG Specimen, including laterality: Left upper lobe Procedure: Wedge resection Specimen integrity (intact/disrupted): Previously incised Tumor site: Left upper lobe ("second nodule") Tumor focality: Unifocal Maximum tumor size (cm): 2.2 cm (gross measurement) Histologic type: Adenocarcinoma with a predominant lepidic ("in situ") growth pattern Grade: Well differentiated. Margins: Negative for adenocarcinoma Distance to closest margin (cm): At least 0.5 cm Visceral pleura invasion: Not identified 1 of   Patient making good recovery following surgery. He has returned to normal activity. Denies sob, cough or hemoptysis.   Past Medical History  Diagnosis Date  . Hypertension   . Hyperlipidemia   . Arthritis   . H/O: hysterectomy   . Lesion of left lung 11/22/13    left upper lobe  . Cancer     thyroid  . Shortness of breath     at times  . Hypothyroidism   . CAD (coronary artery disease)     with stent placement. Pt denies CAD or stent placement; had coronary calcifications on CT-had no ischemia, NL LVEF on stress echo 12/28/13 (Dr. Otho Perl; [facility])     History  Smoking status  . Never Smoker   Smokeless tobacco  . Never Used    History  Alcohol Use No     Allergies  Allergen Reactions  . Chlorthalidone Cough  . Lipitor [Atorvastatin]     Muscle weakness  . Zocor [Simvastatin]     Muscle weakness     Current Outpatient Prescriptions  Medication Sig Dispense Refill  .  amLODipine (NORVASC) 5 MG tablet Take 5 mg by mouth daily.    Marland Kitchen aspirin EC 81 MG tablet Take 81 mg by mouth daily.    . calcium-vitamin D (OSCAL WITH D) 500-200 MG-UNIT per tablet Take 1 tablet by mouth daily with breakfast.    . colesevelam (WELCHOL) 625 MG tablet Take 1,250 mg by mouth 2 (two) times daily with a meal.     . furosemide (LASIX) 20 MG tablet Take 20 mg by mouth daily.    Marland Kitchen levothyroxine (SYNTHROID,  LEVOTHROID) 150 MCG tablet Take 150 mcg by mouth daily before breakfast.    . Omega-3 Fatty Acids (FISH OIL) 1000 MG CAPS Take 1 capsule by mouth daily.    Marland Kitchen telmisartan-hydrochlorothiazide (MICARDIS HCT) 80-25 MG per tablet Take 1 tablet by mouth daily.     No current facility-administered medications for this visit.       Physical Exam: BP 152/91 mmHg  Pulse 79  Resp 16  Ht 5\' 6"  (1.676 m)  Wt 180 lb (81.647 kg)  BMI 29.07 kg/m2  SpO2 96%  General appearance: alert, cooperative and no distress Heart: regular rate and rhythm, S1, S2 normal, no murmur, click, rub or gallop Lungs: clear to auscultation bilaterally Abdomen: benign exam Extremities: minor lower ext edema Wound: healing well without evidence of infection   Diagnostic Studies & Laboratory data:     Recent Radiology Findings:   Dg Chest 2 View  12/20/2014   CLINICAL DATA:  History of surgery for left lung malignancy and September of 2015, coronary artery disease, thyroid malignancy, nonsmoker  EXAM: CHEST  2 VIEW  COMPARISON:  PA and lateral chest of September 17, 2014  FINDINGS: The lungs are well-expanded. There is stable scarring/postsurgical change in the left upper lobe. There is chronic prominence of the right upper lobe pulmonary vascularity. There is no pleural effusion. There is a stable calcified nodule which appears to lie in the left breast. The heart is normal in size. There is no pulmonary vascular congestion or interstitial edema. There is mild stable loss of height of a mid thoracic vertebral body.  IMPRESSION: There are chronic changes within both lungs. There is no evidence of recurrent malignancy, pneumonia, CHF, nor other active cardiopulmonary disease.   Electronically Signed   By: David  Martinique   On: 12/20/2014 13:53    I have independently reviewed the above radiology studies  and reviewed the findings with the patient.   Recent Lab Findings: Lab Results  Component Value Date   WBC 5.8 08/10/2014    HGB 11.9* 08/10/2014   HCT 35.9* 08/10/2014   PLT 229 08/10/2014   GLUCOSE 93 08/10/2014   ALT 10 08/10/2014   AST 19 08/10/2014   NA 141 08/10/2014   K 4.1 08/10/2014   CL 102 08/10/2014   CREATININE 0.74 08/10/2014   BUN 13 08/10/2014   CO2 26 08/10/2014   INR 0.97 08/06/2014      Assessment / Plan:  Doing well with steady recovery.              Follow up ct of chest 6 months post op-  Return late March 2016 with ct of chest    Grace Isaac MD      [address] [place] [address] Office [phone]   Beeper [phone]  12/20/2014 2:16 PM

## 2014-12-24 ENCOUNTER — Other Ambulatory Visit: Payer: Self-pay | Admitting: *Deleted

## 2014-12-24 DIAGNOSIS — C3490 Malignant neoplasm of unspecified part of unspecified bronchus or lung: Secondary | ICD-10-CM

## 2015-01-01 ENCOUNTER — Encounter (HOSPITAL_COMMUNITY): Payer: Self-pay | Admitting: *Deleted

## 2015-02-13 IMAGING — CR DG CHEST 2V
3 series · 3 of 3 positions shown · non-contrast
Comparison: August 23, 2014

CLINICAL DATA: History of lung carcinoma; status post surgery
approximately 1 month prior

EXAM:
CHEST  2 VIEW

[w chest pa]
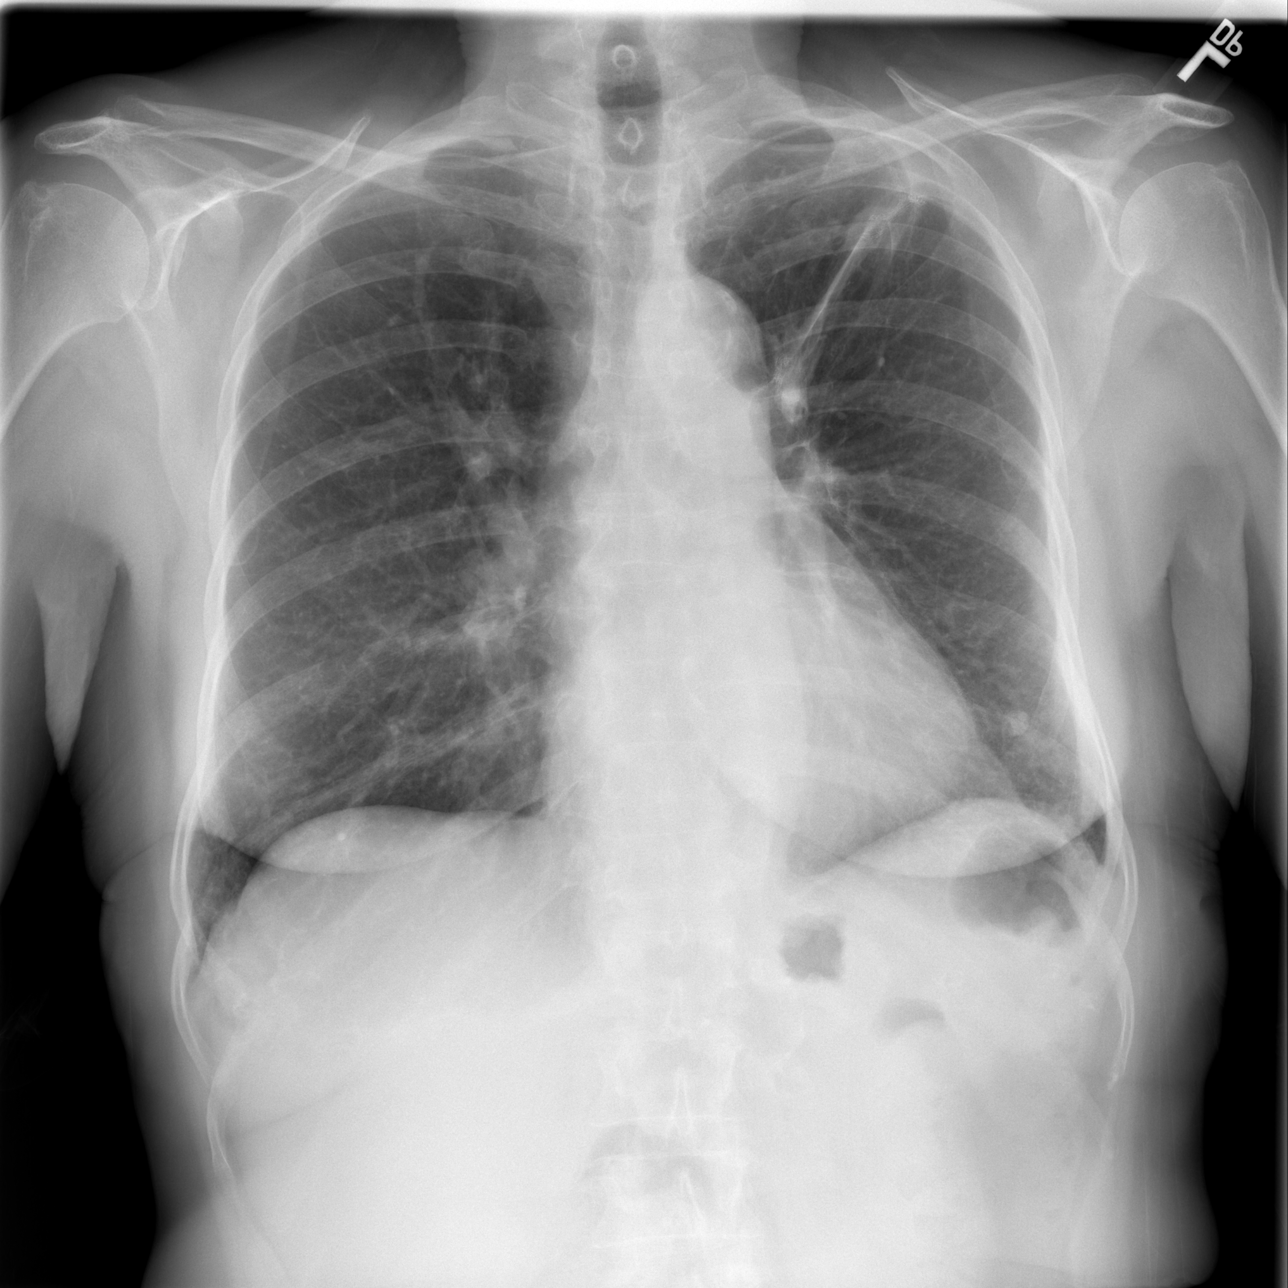

[w chest lat (1 of 2)]
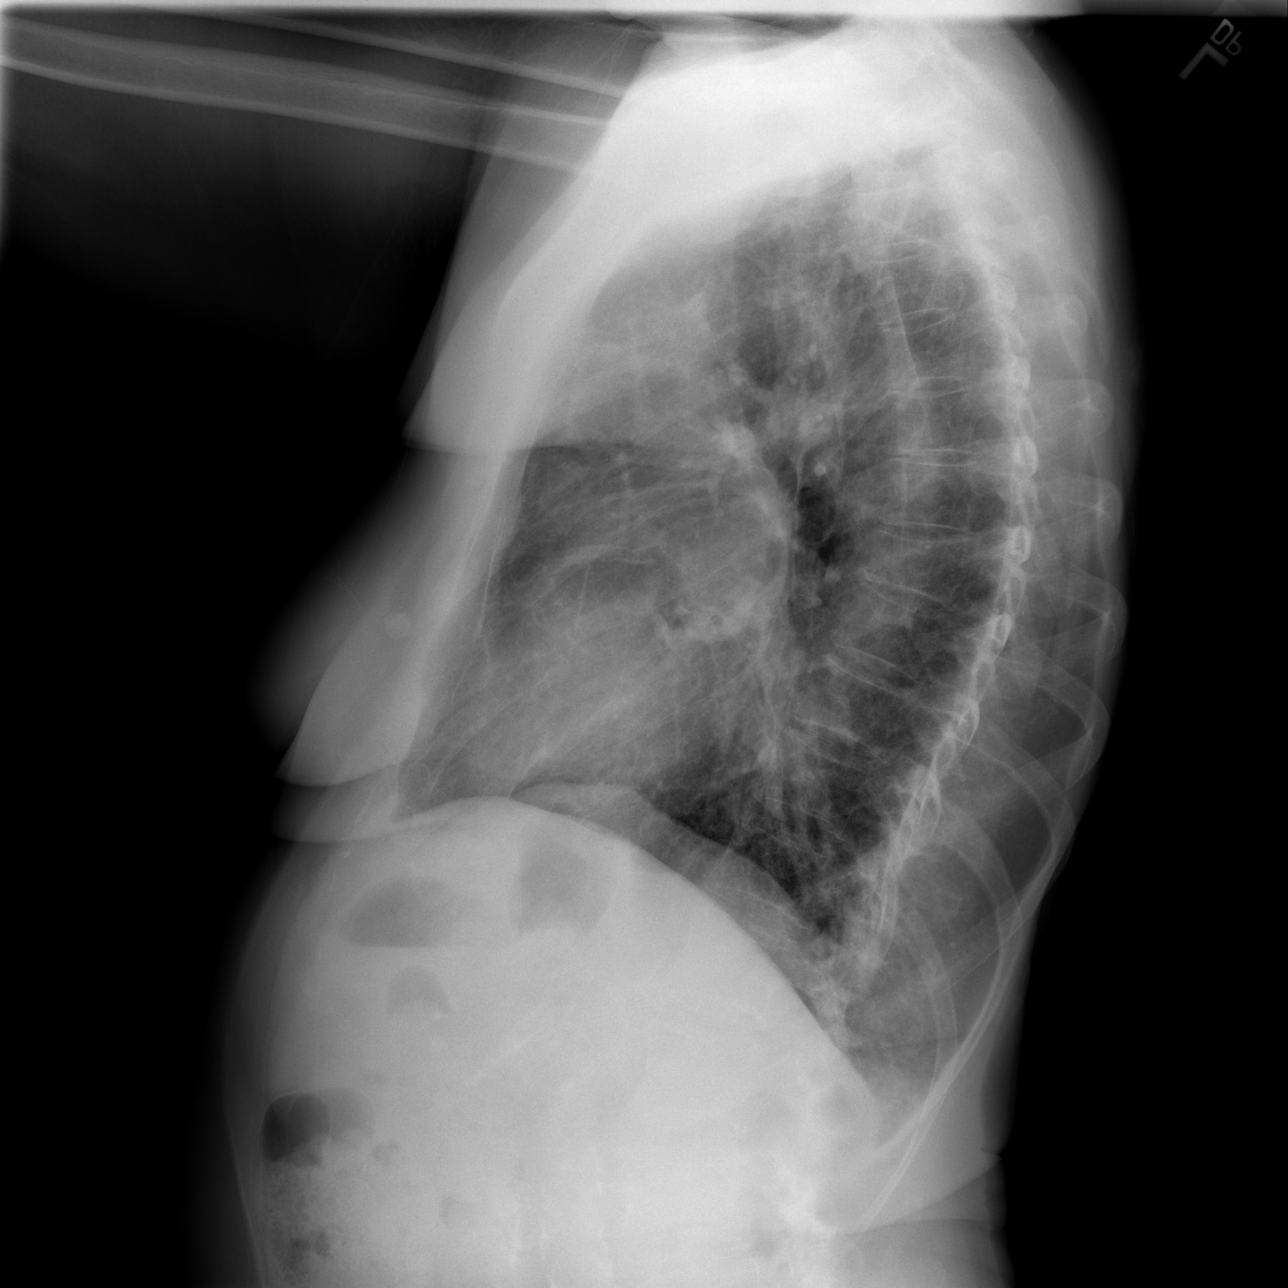

[w chest lat (2 of 2)]
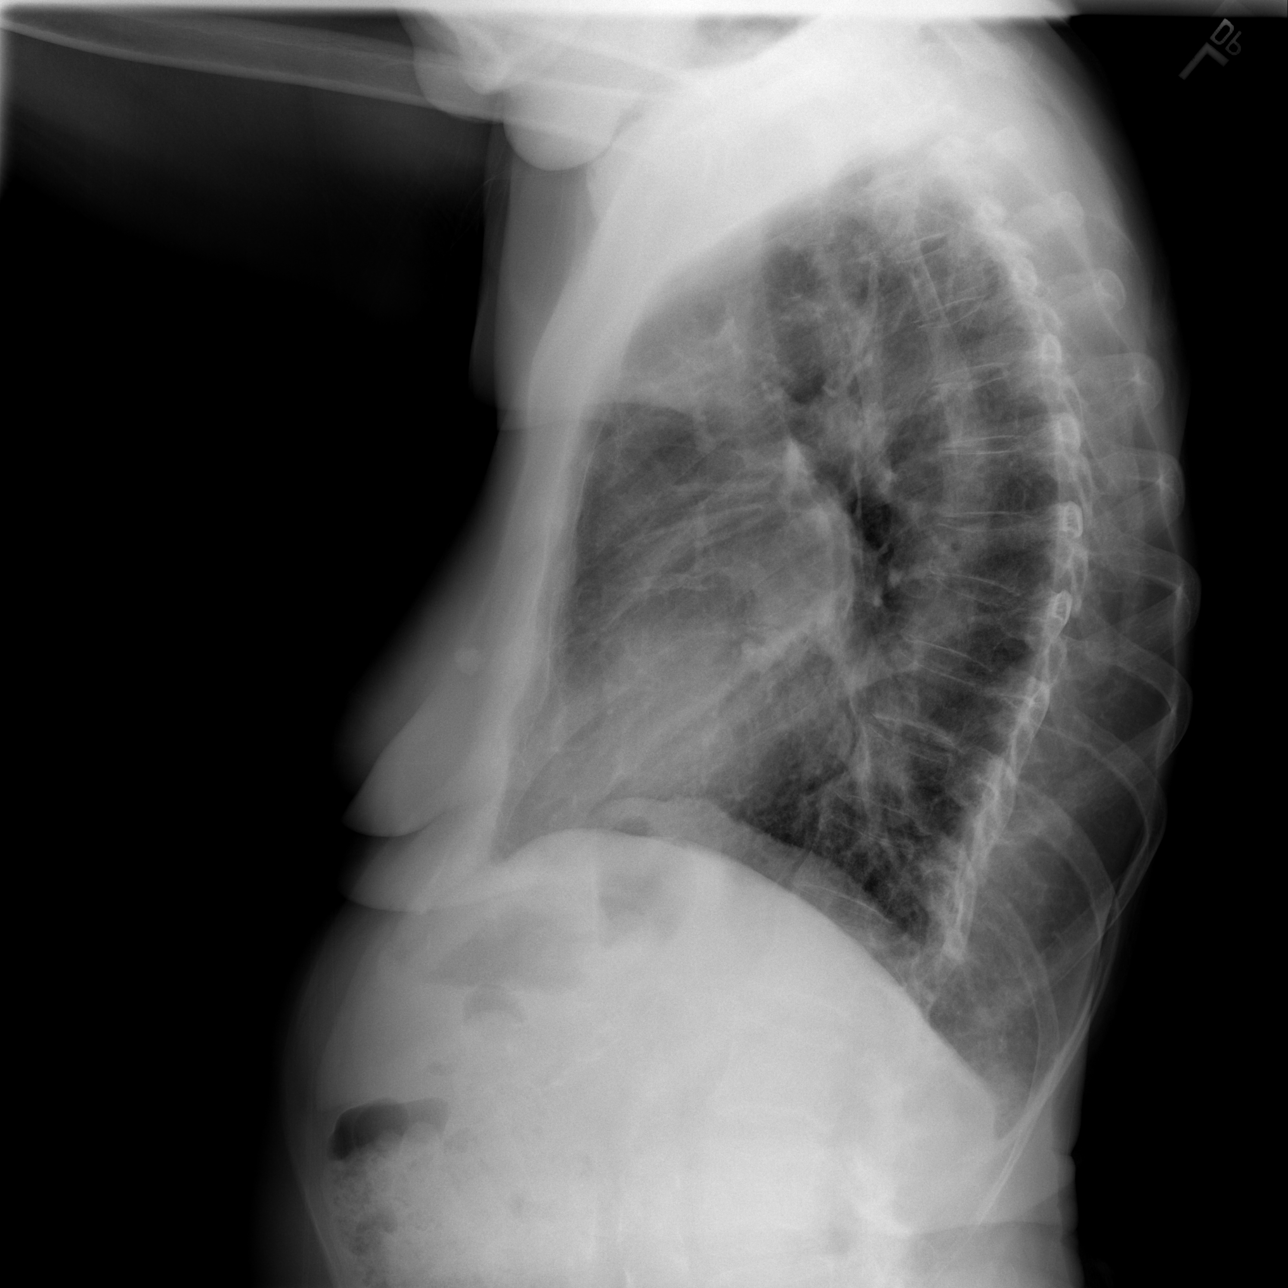

[3 of 3 positions shown; findings below may reference images not displayed]

FINDINGS: Subcutaneous emphysema and left-sided pneumothorax have resolved in
the interval since the prior study. There is scarring in the left
upper lobe with lateral left apical pleural thickening consistent
with the postoperative change. There is a small calcified granuloma
in the extreme anterior left lung base region near the chest wall.
Lungs elsewhere are clear. Heart size is normal. Pulmonary
vascularity is within normal limits given the underlying
postoperative change on the left. There is no adenopathy apparent.
There is atherosclerotic change in the aorta. There is anterior
wedging of a mid thoracic vertebral body, stable.
IMPRESSION: Postoperative scarring left upper lobe. Interval resolution of left
apical pneumothorax and left sided subcutaneous air. No new opacity.
No change in cardiac silhouette.

## 2015-02-21 ENCOUNTER — Ambulatory Visit
Admission: RE | Admit: 2015-02-21 | Discharge: 2015-02-21 | Disposition: A | Discharge status: Home / Self Care | Payer: Medicare PPO | Source: Ambulatory Visit | Attending: Cardiothoracic Surgery | Admitting: Cardiothoracic Surgery

## 2015-02-21 ENCOUNTER — Encounter: Payer: Self-pay | Admitting: Cardiothoracic Surgery

## 2015-02-21 ENCOUNTER — Ambulatory Visit (INDEPENDENT_AMBULATORY_CARE_PROVIDER_SITE_OTHER): Payer: Medicare PPO | Admitting: Cardiothoracic Surgery

## 2015-02-21 VITALS — BP 140/84 | HR 80 | Resp 20 | Ht 66.0 in | Wt 180.0 lb

## 2015-02-21 DIAGNOSIS — J984 Other disorders of lung: Secondary | ICD-10-CM | POA: Diagnosis not present

## 2015-02-21 DIAGNOSIS — Z9889 Other specified postprocedural states: Secondary | ICD-10-CM

## 2015-02-21 DIAGNOSIS — C3412 Malignant neoplasm of upper lobe, left bronchus or lung: Secondary | ICD-10-CM | POA: Diagnosis not present

## 2015-02-21 DIAGNOSIS — C3490 Malignant neoplasm of unspecified part of unspecified bronchus or lung: Secondary | ICD-10-CM

## 2015-02-21 DIAGNOSIS — R911 Solitary pulmonary nodule: Secondary | ICD-10-CM

## 2015-02-21 NOTE — Progress Notes (Signed)
[address]       [place] [address]             [phone]      Nicole Ortiz [place] Medical Record #[id number] Date of Birth: [birth year]  Referring: Gardiner Rhyme, MD Primary Care: Alma Friendly, MD  Chief Complaint:   POST OP FOLLOW UP 08/08/2014  OPERATIVE REPORT  PREOPERATIVE DIAGNOSIS: Left upper lobe lung lesion, mildly  hypermetabolic.  POSTOPERATIVE DIAGNOSIS: Inflammatory nodules, left upper lung x2. No  evidence of malignancy.  PROCEDURES PERFORMED: Bronchoscopy, left video-assisted thoracoscopy,  wedge resection of left upper lobe x2 and lymph node biopsy, placement  of On-Q device.  SURGEON: Lanelle Bal, MD  History of Present Illness:     Lung cancer,  left upper lobe   Primary site: Lung (Left)   Staging method: AJCC 7th Edition   Pathologic: Stage IA (T1b, N0, cM0) signed by Grace Isaac, MD on 08/10/2014  6:54 AM   Summary: Stage IA (T1b, N0, cM0)    Patient making good recovery following surgery for resection of 2 lung nodules one which was found to be a stage Ia carcinoma the lung. . The patient has has returned to normal activity. Denies sob, cough or hemoptysis. She is not limited by shortness of breath.  Past Medical History  Diagnosis Date  . Hypertension   . Hyperlipidemia   . Arthritis   . H/O: hysterectomy   . Lesion of left lung 11/22/13    left upper lobe  . Cancer     thyroid  . Shortness of breath     at times  . Hypothyroidism   . CAD (coronary artery disease)     with stent placement. Pt denies CAD or stent placement; had coronary calcifications on CT-had no ischemia, NL LVEF on stress echo 12/28/13 (Dr. Otho Perl; [facility])     History  Smoking status  . Never Smoker   Smokeless tobacco  . Never Used    History  Alcohol Use No     Allergies  Allergen Reactions  . Chlorthalidone Cough  . Lipitor [Atorvastatin]     Muscle weakness  . Zocor [Simvastatin]     Muscle weakness      Current Outpatient Prescriptions  Medication Sig Dispense Refill  . amLODipine (NORVASC) 5 MG tablet Take 5 mg by mouth daily.    Marland Kitchen aspirin EC 81 MG tablet Take 81 mg by mouth daily.    . calcium-vitamin D (OSCAL WITH D) 500-200 MG-UNIT per tablet Take 1 tablet by mouth daily with breakfast.    . colesevelam (WELCHOL) 625 MG tablet Take 1,250 mg by mouth 2 (two) times daily with a meal.     . furosemide (LASIX) 20 MG tablet Take 20 mg by mouth daily.    Marland Kitchen levothyroxine (SYNTHROID, LEVOTHROID) 150 MCG tablet Take 150 mcg by mouth daily before breakfast.    . Omega-3 Fatty Acids (FISH OIL) 1000 MG CAPS Take 1 capsule by mouth daily.    Marland Kitchen telmisartan-hydrochlorothiazide (MICARDIS HCT) 80-25 MG per tablet Take 1 tablet by mouth daily.     No current facility-administered medications for this visit.       Physical Exam: BP 140/84 mmHg  Pulse 80  Resp 20  Ht 5\' 6"  (1.676 m)  Wt 180 lb (81.647 kg)  BMI 29.07 kg/m2  SpO2 98%  General appearance: alert, cooperative and no distress Heart: regular rate and rhythm, S1, S2 normal, no murmur, click,  rub or gallop Lungs: clear to auscultation bilaterally Abdomen: benign exam Extremities: minor lower ext edema Wound: healing well without evidence of infection Patient has no cervical supraclavicular adenopathy  Diagnostic Studies & Laboratory data:     Recent Radiology Findings:   Ct Chest Wo Contrast  02/21/2015   CLINICAL DATA:  Left upper lobe cancer with lobectomy in December 2014. Prior thyroid cancer in 2013 with thyroidectomy. Productive cough.  EXAM: CT CHEST WITHOUT CONTRAST  TECHNIQUE: Multidetector CT imaging of the chest was performed following the standard protocol without IV contrast.  COMPARISON:  Multiple exams, including 06/21/2014  FINDINGS: Mediastinum/Nodes: At the thoracic inlet a lymph node adjacent to the left carotid artery measures 7 mm in short axis, image 6 series 3, upper normal size. No pathologic thoracic  adenopathy identified. Left anterior descending coronary artery stent noted. Aortic and branch vessel atherosclerotic calcification.  Lungs/Pleura: Left upper lobe wedge resections observed. There is slight lobularity along the upper margin of the wedge resection on image 8 of series 4 which could simply be due to scarring but which merits observation. No specific indicators of recurrent malignancy.  Patchy interstitial accentuation and hazy opacity peripherally in portions of both lungs but favoring the lower lobes, with underlying bronchiectasis. The possibility of early idiopathic pulmonary fibrosis is not excluded.  Upper abdomen: Small hypodense lesion in segment 4 of the liver, no change from 06/21/2014 and not previously hypermetabolic, likely a cyst, hemangioma, or similar benign lesion.  Musculoskeletal: Mild T7 anterior wedging appears chronic.  IMPRESSION: 1. Feet scarring along the wedge resection site likely merits periodic observation, but no recurrent malignancy is identified. 2. Hazy ground-glass opacity in the lung bases with faint interstitial accentuation and and peripheral bronchiectasis -early idiopathic pulmonary fibrosis is not excluded. No overt honeycombing. 3. Atherosclerosis.   Electronically Signed   By: Van Clines M.D.   On: 02/21/2015 14:07    I have independently reviewed the above radiology studies  and reviewed the findings with the patient.   Recent Lab Findings: Lab Results  Component Value Date   WBC 5.8 08/10/2014   HGB 11.9* 08/10/2014   HCT 35.9* 08/10/2014   PLT 229 08/10/2014   GLUCOSE 93 08/10/2014   ALT 10 08/10/2014   AST 19 08/10/2014   NA 141 08/10/2014   K 4.1 08/10/2014   CL 102 08/10/2014   CREATININE 0.74 08/10/2014   BUN 13 08/10/2014   CO2 26 08/10/2014   INR 0.97 08/06/2014   Diagnosis 1. Lung, wedge biopsy/resection, left upper lobe - DENSELY INFLAMED LUNG PARENCHYMA. - THERE IS NO EVIDENCE OF MALIGNANCY. - SEE COMMENT. 2.  Lung, wedge biopsy/resection, additional margin left upper lobe - BENIGN MILDLY INFLAMED LUNG PARENCHYMA WITH HEMORRHAGE. - THERE IS NO EVIDENCE OF MALIGNANCY. 3. Lung, wedge biopsy/resection, left upper lobe second nodule - ADENOCARCINOMA WITH A PREDOMINANT LEPIDIC GROWTH PATTERN, WELL DIFFERENTIATED, SPANNING 2.2 CM. - THE SURGICAL RESECTION MARGINS ARE NEGATIVE FOR ADENOCARCINOMA. - SEE ONCOLOGY TABLE BELOW. 4. Lymph node, biopsy, 10 L node - THERE IS NO EVIDENCE OF CARCINOMA IN 1 OF 1 LYMPH NODE (0/1). 5. Lymph node, biopsy, 4 L node - THERE IS NO EVIDENCE OF CARCINOMA IN 1 OF 1 LYMPH NODE (0/1). Microscopic Comment 1. The histologic features may be seen in a resolving pneumonia/pneumonitis. Clinical correlation is recommended. Nonetheless, malignant features are not identified. Dr. Claudette Laws has reviewed selected slides of part 1 and concurs that this is a benign lesion. 3. LUNG Specimen, including laterality: Left  upper lobe Procedure: Wedge resection Specimen integrity (intact/disrupted): Previously incised Tumor site: Left upper lobe ("second nodule") Tumor focality: Unifocal Maximum tumor size (cm): 2.2 cm (gross measurement) Histologic type: Adenocarcinoma with a predominant lepidic ("in situ") growth pattern Grade: Well differentiated. Margins: Negative for adenocarcinoma Distance to closest margin (cm): At least 0.5 cm Visceral pleura invasion: Not identified 1 of    Assessment / Plan:    No evidence of recurrent carcinoma the lung  Follow up ct of chest in 6 months which will be approximately 12 months post op-   Grace Isaac MD      [address] [place] [address] Office [phone]   Beeper [phone]  02/21/2015 4:16 PM

## 2015-07-18 ENCOUNTER — Other Ambulatory Visit: Payer: Self-pay | Admitting: *Deleted

## 2015-07-18 DIAGNOSIS — R911 Solitary pulmonary nodule: Secondary | ICD-10-CM

## 2015-08-22 ENCOUNTER — Ambulatory Visit
Admission: RE | Admit: 2015-08-22 | Discharge: 2015-08-22 | Disposition: A | Discharge status: Home / Self Care | Payer: Medicare PPO | Source: Ambulatory Visit | Attending: Cardiothoracic Surgery | Admitting: Cardiothoracic Surgery

## 2015-08-22 ENCOUNTER — Encounter: Payer: Self-pay | Admitting: Cardiothoracic Surgery

## 2015-08-22 ENCOUNTER — Ambulatory Visit (INDEPENDENT_AMBULATORY_CARE_PROVIDER_SITE_OTHER): Payer: Medicare PPO | Admitting: Cardiothoracic Surgery

## 2015-08-22 VITALS — BP 135/89 | HR 78 | Resp 16 | Ht 66.0 in | Wt 183.5 lb

## 2015-08-22 DIAGNOSIS — C3412 Malignant neoplasm of upper lobe, left bronchus or lung: Secondary | ICD-10-CM | POA: Diagnosis not present

## 2015-08-22 DIAGNOSIS — R911 Solitary pulmonary nodule: Secondary | ICD-10-CM

## 2015-08-22 DIAGNOSIS — Z09 Encounter for follow-up examination after completed treatment for conditions other than malignant neoplasm: Secondary | ICD-10-CM

## 2015-08-22 NOTE — Progress Notes (Signed)
[address]       [place] [address]             [phone]      Nicole Ortiz [place] Medical Record #[id number] Date of Birth: 01-11-1936  Referring: Gardiner Rhyme, MD Primary Care: Alma Friendly, MD  Chief Complaint:   POST OP FOLLOW UP 08/08/2014  OPERATIVE REPORT  PREOPERATIVE DIAGNOSIS: Left upper lobe lung lesion, mildly  hypermetabolic.  POSTOPERATIVE DIAGNOSIS: Inflammatory nodules, left upper lung x2. No  evidence of malignancy.  PROCEDURES PERFORMED: Bronchoscopy, left video-assisted thoracoscopy,  wedge resection of left upper lobe x2 and lymph node biopsy, placement  of On-Q device.  SURGEON: Lanelle Bal, MD  History of Present Illness:     Lung cancer,  left upper lobe   Primary site: Lung (Left)   Staging method: AJCC 7th Edition   Pathologic: Stage IA (T1b, N0, cM0) signed by Grace Isaac, MD on 08/10/2014  6:54 AM   Summary: Stage IA (T1b, N0, cM0)    Patient making good recovery following surgery for resection of 2 lung nodules one which was found to be a stage Ia carcinoma the lung. . The patient has has returned to normal activity. Denies sob, cough or hemoptysis. She is not limited by shortness of breath. She is returned to near normal activities without limitations.  Past Medical History  Diagnosis Date  . Hypertension   . Hyperlipidemia   . Arthritis   . H/O: hysterectomy   . Lesion of left lung 11/22/13    left upper lobe  . Cancer     thyroid  . Shortness of breath     at times  . Hypothyroidism   . CAD (coronary artery disease)     with stent placement. Pt denies CAD or stent placement; had coronary calcifications on CT-had no ischemia, NL LVEF on stress echo 12/28/13 (Dr. Otho Perl; [facility])     History  Smoking status  . Never Smoker   Smokeless tobacco  . Never Used    History  Alcohol Use No     Allergies  Allergen Reactions  . Chlorthalidone Cough  . Lipitor [Atorvastatin]    Muscle weakness  . Zocor [Simvastatin]     Muscle weakness     Current Outpatient Prescriptions  Medication Sig Dispense Refill  . amLODipine (NORVASC) 5 MG tablet Take 5 mg by mouth daily.    Marland Kitchen aspirin EC 81 MG tablet Take 81 mg by mouth daily.    . calcium-vitamin D (OSCAL WITH D) 500-200 MG-UNIT per tablet Take 1 tablet by mouth daily with breakfast.    . colesevelam (WELCHOL) 625 MG tablet Take 1,250 mg by mouth 2 (two) times daily with a meal.     . furosemide (LASIX) 20 MG tablet Take 20 mg by mouth daily.    Marland Kitchen levothyroxine (SYNTHROID, LEVOTHROID) 150 MCG tablet Take 150 mcg by mouth daily before breakfast.    . Omega-3 Fatty Acids (FISH OIL) 1000 MG CAPS Take 1 capsule by mouth daily.    Marland Kitchen telmisartan-hydrochlorothiazide (MICARDIS HCT) 80-25 MG per tablet Take 1 tablet by mouth daily.     No current facility-administered medications for this visit.       Physical Exam :BP 135/89 mmHg  Pulse 78  Resp 16  Ht '5\' 6"'$  (1.676 m)  Wt 183 lb 8 oz (83.235 kg)  BMI 29.63 kg/m2  SpO2 97%   General appearance: alert, cooperative and no distress Heart:  regular rate and rhythm, S1, S2 normal, no murmur, click, rub or gallop Lungs: clear to auscultation bilaterally Abdomen: benign exam Extremities: minor lower ext edema Wound: healing well without evidence of infection Patient has no cervical supraclavicular adenopathy  Diagnostic Studies & Laboratory data:     Recent Radiology Findings:  Ct Chest Wo Contrast  08/22/2015   CLINICAL DATA:  LEFT lung cancer with LEFT lobectomy September 2015.  EXAM: CT CHEST WITHOUT CONTRAST  TECHNIQUE: Multidetector CT imaging of the chest was performed following the standard protocol without IV contrast.  COMPARISON:  None.  FINDINGS: Mediastinum/Nodes: No axillary or supraclavicular lymphadenopathy. No mediastinal or hilar lymphadenopathy. No pericardial fluid. Coronary stents noted. Esophagus normal.  Lungs/Pleura: There is a band of linear  scarring in the LEFT upper lobe along the surgical margin. No new nodularity. Mild reticulation at the LEFT lung base. RIGHT lung is clear.  Upper abdomen: Low-density 12 mm lesion in the LEFT hepatic lobe has simple fluid attenuation and not changed from comparison exam (Image 52, series 3). Adrenal glands are normal.  Musculoskeletal: No aggressive osseous lesion.  IMPRESSION: 1. Postsurgical change in the LEFT upper lobe without evidence of local recurrence. 2. No evidence of metastasis in the thorax.   Electronically Signed   By: Suzy Bouchard M.D.   On: 08/22/2015 11:38    Ct Chest Wo Contrast  02/21/2015   CLINICAL DATA:  Left upper lobe cancer with lobectomy in December 2014. Prior thyroid cancer in 2013 with thyroidectomy. Productive cough.  EXAM: CT CHEST WITHOUT CONTRAST  TECHNIQUE: Multidetector CT imaging of the chest was performed following the standard protocol without IV contrast.  COMPARISON:  Multiple exams, including 06/21/2014  FINDINGS: Mediastinum/Nodes: At the thoracic inlet a lymph node adjacent to the left carotid artery measures 7 mm in short axis, image 6 series 3, upper normal size. No pathologic thoracic adenopathy identified. Left anterior descending coronary artery stent noted. Aortic and branch vessel atherosclerotic calcification.  Lungs/Pleura: Left upper lobe wedge resections observed. There is slight lobularity along the upper margin of the wedge resection on image 8 of series 4 which could simply be due to scarring but which merits observation. No specific indicators of recurrent malignancy.  Patchy interstitial accentuation and hazy opacity peripherally in portions of both lungs but favoring the lower lobes, with underlying bronchiectasis. The possibility of early idiopathic pulmonary fibrosis is not excluded.  Upper abdomen: Small hypodense lesion in segment 4 of the liver, no change from 06/21/2014 and not previously hypermetabolic, likely a cyst, hemangioma, or similar  benign lesion.  Musculoskeletal: Mild T7 anterior wedging appears chronic.  IMPRESSION: 1. Feet scarring along the wedge resection site likely merits periodic observation, but no recurrent malignancy is identified. 2. Hazy ground-glass opacity in the lung bases with faint interstitial accentuation and and peripheral bronchiectasis -early idiopathic pulmonary fibrosis is not excluded. No overt honeycombing. 3. Atherosclerosis.   Electronically Signed   By: Van Clines M.D.   On: 02/21/2015 14:07    I have independently reviewed the above radiology studies  and reviewed the findings with the patient.   Recent Lab Findings: Lab Results  Component Value Date   WBC 5.8 08/10/2014   HGB 11.9* 08/10/2014   HCT 35.9* 08/10/2014   PLT 229 08/10/2014   GLUCOSE 93 08/10/2014   ALT 10 08/10/2014   AST 19 08/10/2014   NA 141 08/10/2014   K 4.1 08/10/2014   CL 102 08/10/2014   CREATININE 0.74 08/10/2014   BUN  13 08/10/2014   CO2 26 08/10/2014   INR 0.97 08/06/2014   Diagnosis 1. Lung, wedge biopsy/resection, left upper lobe - DENSELY INFLAMED LUNG PARENCHYMA. - THERE IS NO EVIDENCE OF MALIGNANCY. - SEE COMMENT. 2. Lung, wedge biopsy/resection, additional margin left upper lobe - BENIGN MILDLY INFLAMED LUNG PARENCHYMA WITH HEMORRHAGE. - THERE IS NO EVIDENCE OF MALIGNANCY. 3. Lung, wedge biopsy/resection, left upper lobe second nodule - ADENOCARCINOMA WITH A PREDOMINANT LEPIDIC GROWTH PATTERN, WELL DIFFERENTIATED, SPANNING 2.2 CM. - THE SURGICAL RESECTION MARGINS ARE NEGATIVE FOR ADENOCARCINOMA. - SEE ONCOLOGY TABLE BELOW. 4. Lymph node, biopsy, 10 L node - THERE IS NO EVIDENCE OF CARCINOMA IN 1 OF 1 LYMPH NODE (0/1). 5. Lymph node, biopsy, 4 L node - THERE IS NO EVIDENCE OF CARCINOMA IN 1 OF 1 LYMPH NODE (0/1). Microscopic Comment 1. The histologic features may be seen in a resolving pneumonia/pneumonitis. Clinical correlation is recommended. Nonetheless, malignant features are not  identified. Dr. Claudette Laws has reviewed selected slides of part 1 and concurs that this is a benign lesion. 3. LUNG Specimen, including laterality: Left upper lobe Procedure: Wedge resection Specimen integrity (intact/disrupted): Previously incised Tumor site: Left upper lobe ("second nodule") Tumor focality: Unifocal Maximum tumor size (cm): 2.2 cm (gross measurement) Histologic type: Adenocarcinoma with a predominant lepidic ("in situ") growth pattern Grade: Well differentiated. Margins: Negative for adenocarcinoma Distance to closest margin (cm): At least 0.5 cm Visceral pleura invasion: Not identified 1 of    Assessment / Plan:  No evidence of recurrent carcinoma the lung- on follow-up CT scan now proximally 1 year postop, we have reviewed CT scan with her and we'll plan to see her back in 6 months with a follow-up CT scan of the chest Have encouraged patient to stay up-to-date on pneumococcal and flu vaccinations  Grace Isaac MD      [address] [place] [address] Office [phone]   Beeper [phone]  08/22/2015 12:44 PM

## 2016-01-16 ENCOUNTER — Other Ambulatory Visit: Payer: Self-pay | Admitting: *Deleted

## 2016-01-16 DIAGNOSIS — C3412 Malignant neoplasm of upper lobe, left bronchus or lung: Secondary | ICD-10-CM

## 2016-02-13 ENCOUNTER — Ambulatory Visit
Admission: RE | Admit: 2016-02-13 | Discharge: 2016-02-13 | Disposition: A | Discharge status: Home / Self Care | Payer: Medicare Other | Source: Ambulatory Visit | Attending: Cardiothoracic Surgery | Admitting: Cardiothoracic Surgery

## 2016-02-13 ENCOUNTER — Ambulatory Visit (INDEPENDENT_AMBULATORY_CARE_PROVIDER_SITE_OTHER): Payer: Medicare Other | Admitting: Cardiothoracic Surgery

## 2016-02-13 ENCOUNTER — Encounter: Payer: Self-pay | Admitting: Cardiothoracic Surgery

## 2016-02-13 VITALS — BP 155/90 | HR 72 | Resp 20 | Ht 66.0 in | Wt 187.0 lb

## 2016-02-13 DIAGNOSIS — C3412 Malignant neoplasm of upper lobe, left bronchus or lung: Secondary | ICD-10-CM

## 2016-02-13 DIAGNOSIS — Z09 Encounter for follow-up examination after completed treatment for conditions other than malignant neoplasm: Secondary | ICD-10-CM

## 2016-02-13 NOTE — Progress Notes (Signed)
[address]       [place] [address]             [phone]      Fiora S Mantione [place] Medical Record #[id number] Date of Birth: [birth year]  Referring: Gardiner Rhyme, MD Primary Care: Alma Friendly, MD  Chief Complaint:   POST OP FOLLOW UP 08/08/2014  OPERATIVE REPORT  PREOPERATIVE DIAGNOSIS: Left upper lobe lung lesion, mildly  hypermetabolic.  POSTOPERATIVE DIAGNOSIS: Inflammatory nodules, left upper lung x2. No  evidence of malignancy.  PROCEDURES PERFORMED: Bronchoscopy, left video-assisted thoracoscopy,  wedge resection of left upper lobe x2 and lymph node biopsy, placement  of On-Q device.  SURGEON: Lanelle Bal, MD  History of Present Illness:     Lung cancer,  left upper lobe   Primary site: Lung (Left)   Staging method: AJCC 7th Edition   Pathologic: Stage IA (T1b, N0, cM0) signed by Grace Isaac, MD on 08/10/2014  6:54 AM   Summary: Stage IA (T1b, N0, cM0)    Patient doing well after  surgery for resection of 2 lung nodules one which was found to be a stage Ia carcinoma the lung.  Patient is now 18 months postop . The patient has has returned to normal activity. Denies sob, cough or hemoptysis. She is not limited by shortness of breath. She has no respiratory complaints, denies hemoptysis or recurrent pulmonary infections.  Past Medical History  Diagnosis Date  . Hypertension   . Hyperlipidemia   . Arthritis   . H/O: hysterectomy   . Lesion of left lung 11/22/13    left upper lobe  . Cancer ([place])     thyroid  . Shortness of breath     at times  . Hypothyroidism   . CAD (coronary artery disease)     with stent placement. Pt denies CAD or stent placement; had coronary calcifications on CT-had no ischemia, NL LVEF on stress echo 12/28/13 (Dr. Otho Perl; [facility])     History  Smoking status  . Never Smoker   Smokeless tobacco  . Never Used    History  Alcohol Use No     Allergies  Allergen Reactions  .  Chlorthalidone Cough  . Lipitor [Atorvastatin]     Muscle weakness  . Zocor [Simvastatin]     Muscle weakness     Current Outpatient Prescriptions  Medication Sig Dispense Refill  . amLODipine (NORVASC) 5 MG tablet Take 5 mg by mouth daily.    Marland Kitchen aspirin EC 81 MG tablet Take 81 mg by mouth daily.    . calcium-vitamin D (OSCAL WITH D) 500-200 MG-UNIT per tablet Take 1 tablet by mouth daily with breakfast.    . colesevelam (WELCHOL) 625 MG tablet Take 1,250 mg by mouth 2 (two) times daily with a meal.     . furosemide (LASIX) 20 MG tablet Take 20 mg by mouth daily.    Marland Kitchen levothyroxine (SYNTHROID, LEVOTHROID) 150 MCG tablet Take 150 mcg by mouth daily before breakfast.    . Omega-3 Fatty Acids (FISH OIL) 1000 MG CAPS Take 1 capsule by mouth daily.    Marland Kitchen telmisartan-hydrochlorothiazide (MICARDIS HCT) 80-25 MG per tablet Take 1 tablet by mouth daily.     No current facility-administered medications for this visit.       Physical Exam :BP 155/90 mmHg  Pulse 72  Resp 20  Ht '5\' 6"'$  (1.676 m)  Wt 187 lb (84.823 kg)  BMI 30.20 kg/m2  SpO2 98%  General appearance: alert, cooperative and no distress Heart: regular rate and rhythm, S1, S2 normal, no murmur, click, rub or gallop Lungs: clear to auscultation bilaterally Abdomen: benign exam Extremities: minor lower ext edema Wound: healing well without evidence of infection Patient has no cervical supraclavicular adenopathy  Diagnostic Studies & Laboratory data:     Recent Radiology Findings:  Ct Chest Wo Contrast  02/13/2016  CLINICAL DATA:  LEFT upper lobe surgery for lung cancer in September 2015. Subsequent treatment evaluation. EXAM: CT CHEST WITHOUT CONTRAST TECHNIQUE: Multidetector CT imaging of the chest was performed following the standard protocol without IV contrast. COMPARISON:  CT 08/22/2015 FINDINGS: Mediastinum/Nodes: No axillary supraclavicular adenopathy. No mediastinal hilar adenopathy. No pericardial fluid. Coronary  artery calcifications present. Esophagus normal. Lungs/Pleura: There is linear nodular thickening in the LEFT upper lobe along the surgical resection site which is not changed compared to prior. Nodular component measures approximately 16 mm (image 11, series 4) compared to 15 mm on prior. No new nodularity. Mild ground-glass diffusely within the LEFT lower lobe is unchanged. RIGHT lung is clear. Upper abdomen: Limited view the upper abdomen demonstrates normal adrenal glands. Stable high-density round lesion in the upper pole of the RIGHT kidney. Adjacent simple fluid density cyst appears benign. Atherosclerotic calcification aorta Musculoskeletal: Degenerate changes the spine. IMPRESSION: 1. Stable linear nodular thickening in the LEFT upper lobe at resection site. No evidence of local recurrence. 2. No adenopathy. 3. Coronary artery calcifications Electronically Signed   By: Suzy Bouchard M.D.   On: 02/13/2016 10:00    Ct Chest Wo Contrast  02/21/2015   CLINICAL DATA:  Left upper lobe cancer with lobectomy in December 2014. Prior thyroid cancer in 2013 with thyroidectomy. Productive cough.  EXAM: CT CHEST WITHOUT CONTRAST  TECHNIQUE: Multidetector CT imaging of the chest was performed following the standard protocol without IV contrast.  COMPARISON:  Multiple exams, including 06/21/2014  FINDINGS: Mediastinum/Nodes: At the thoracic inlet a lymph node adjacent to the left carotid artery measures 7 mm in short axis, image 6 series 3, upper normal size. No pathologic thoracic adenopathy identified. Left anterior descending coronary artery stent noted. Aortic and branch vessel atherosclerotic calcification.  Lungs/Pleura: Left upper lobe wedge resections observed. There is slight lobularity along the upper margin of the wedge resection on image 8 of series 4 which could simply be due to scarring but which merits observation. No specific indicators of recurrent malignancy.  Patchy interstitial accentuation and  hazy opacity peripherally in portions of both lungs but favoring the lower lobes, with underlying bronchiectasis. The possibility of early idiopathic pulmonary fibrosis is not excluded.  Upper abdomen: Small hypodense lesion in segment 4 of the liver, no change from 06/21/2014 and not previously hypermetabolic, likely a cyst, hemangioma, or similar benign lesion.  Musculoskeletal: Mild T7 anterior wedging appears chronic.  IMPRESSION: 1. Feet scarring along the wedge resection site likely merits periodic observation, but no recurrent malignancy is identified. 2. Hazy ground-glass opacity in the lung bases with faint interstitial accentuation and and peripheral bronchiectasis -early idiopathic pulmonary fibrosis is not excluded. No overt honeycombing. 3. Atherosclerosis.   Electronically Signed   By: Van Clines M.D.   On: 02/21/2015 14:07    I have independently reviewed the above radiology studies  and reviewed the findings with the patient.   Recent Lab Findings: Lab Results  Component Value Date   WBC 5.8 08/10/2014   HGB 11.9* 08/10/2014   HCT 35.9* 08/10/2014   PLT 229 08/10/2014   GLUCOSE  93 08/10/2014   ALT 10 08/10/2014   AST 19 08/10/2014   NA 141 08/10/2014   K 4.1 08/10/2014   CL 102 08/10/2014   CREATININE 0.74 08/10/2014   BUN 13 08/10/2014   CO2 26 08/10/2014   INR 0.97 08/06/2014   Diagnosis 1. Lung, wedge biopsy/resection, left upper lobe - DENSELY INFLAMED LUNG PARENCHYMA. - THERE IS NO EVIDENCE OF MALIGNANCY. - SEE COMMENT. 2. Lung, wedge biopsy/resection, additional margin left upper lobe - BENIGN MILDLY INFLAMED LUNG PARENCHYMA WITH HEMORRHAGE. - THERE IS NO EVIDENCE OF MALIGNANCY. 3. Lung, wedge biopsy/resection, left upper lobe second nodule - ADENOCARCINOMA WITH A PREDOMINANT LEPIDIC GROWTH PATTERN, WELL DIFFERENTIATED, SPANNING 2.2 CM. - THE SURGICAL RESECTION MARGINS ARE NEGATIVE FOR ADENOCARCINOMA. - SEE ONCOLOGY TABLE BELOW. 4. Lymph node, biopsy,  10 L node - THERE IS NO EVIDENCE OF CARCINOMA IN 1 OF 1 LYMPH NODE (0/1). 5. Lymph node, biopsy, 4 L node - THERE IS NO EVIDENCE OF CARCINOMA IN 1 OF 1 LYMPH NODE (0/1). Microscopic Comment 1. The histologic features may be seen in a resolving pneumonia/pneumonitis. Clinical correlation is recommended. Nonetheless, malignant features are not identified. Dr. Claudette Laws has reviewed selected slides of part 1 and concurs that this is a benign lesion. 3. LUNG Specimen, including laterality: Left upper lobe Procedure: Wedge resection Specimen integrity (intact/disrupted): Previously incised Tumor site: Left upper lobe ("second nodule") Tumor focality: Unifocal Maximum tumor size (cm): 2.2 cm (gross measurement) Histologic type: Adenocarcinoma with a predominant lepidic ("in situ") growth pattern Grade: Well differentiated. Margins: Negative for adenocarcinoma Distance to closest margin (cm): At least 0.5 cm Visceral pleura invasion: Not identified 1 of    Assessment / Plan:  No evidence of recurrent carcinoma the lung- on follow-up CT scan now proximally 18 months postop, we have reviewed CT scan with her and we'll plan to see her back in 6 months with a follow-up CT scan of the chest   Grace Isaac MD      [address] [place] [address] Office [phone]   Beeper [phone]  02/13/2016 10:21 AM

## 2016-07-20 ENCOUNTER — Other Ambulatory Visit: Payer: Self-pay | Admitting: Cardiothoracic Surgery

## 2016-07-20 DIAGNOSIS — C3412 Malignant neoplasm of upper lobe, left bronchus or lung: Secondary | ICD-10-CM

## 2016-08-27 ENCOUNTER — Encounter: Payer: Self-pay | Admitting: Cardiothoracic Surgery

## 2016-08-27 ENCOUNTER — Other Ambulatory Visit: Payer: Medicare Other

## 2016-08-27 ENCOUNTER — Ambulatory Visit
Admission: RE | Admit: 2016-08-27 | Discharge: 2016-08-27 | Disposition: A | Discharge status: Home / Self Care | Payer: Medicare Other | Source: Ambulatory Visit | Attending: Cardiothoracic Surgery | Admitting: Cardiothoracic Surgery

## 2016-08-27 ENCOUNTER — Ambulatory Visit (INDEPENDENT_AMBULATORY_CARE_PROVIDER_SITE_OTHER): Payer: Medicare Other | Admitting: Cardiothoracic Surgery

## 2016-08-27 VITALS — BP 152/86 | HR 85 | Resp 16 | Ht 66.0 in | Wt 185.2 lb

## 2016-08-27 DIAGNOSIS — C3412 Malignant neoplasm of upper lobe, left bronchus or lung: Secondary | ICD-10-CM

## 2016-08-27 DIAGNOSIS — Z09 Encounter for follow-up examination after completed treatment for conditions other than malignant neoplasm: Secondary | ICD-10-CM

## 2016-08-27 NOTE — Progress Notes (Signed)
[address]       [place] [address]             [phone]      Sharonann S Riviere [place] Medical Record #[id number] Date of Birth: [birth year]  Referring: Gardiner Rhyme, MD Primary Care: Alma Friendly, MD  Chief Complaint:   POST OP FOLLOW UP 08/08/2014  OPERATIVE REPORT  PREOPERATIVE DIAGNOSIS: Left upper lobe lung lesion, mildly  hypermetabolic.  POSTOPERATIVE DIAGNOSIS: Inflammatory nodules, left upper lung x2. No  evidence of malignancy.  PROCEDURES PERFORMED: Bronchoscopy, left video-assisted thoracoscopy,  wedge resection of left upper lobe x2 and lymph node biopsy, placement  of On-Q device.  SURGEON: Lanelle Bal, MD  History of Present Illness:     Lung cancer,  left upper lobe   Primary site: Lung (Left)   Staging method: AJCC 7th Edition   Pathologic: Stage IA (T1b, N0, cM0) signed by Grace Isaac, MD on 08/10/2014  6:54 AM   Summary: Stage IA (T1b, N0, cM0)    Patient doing well after  surgery for resection of 2 lung nodules one which was found to be a stage Ia carcinoma the lung.  Patient is now 57month postop . The patient has has returned to normal activity. Denies sob, cough or hemoptysis. She has not gotten flu shot this year yet She is not limited by shortness of breath. She has no respiratory complaints, denies hemoptysis or recurrent pulmonary infections.  Past Medical History:  Diagnosis Date  . Arthritis   . CAD (coronary artery disease)    with stent placement. Pt denies CAD or stent placement; had coronary calcifications on CT-had no ischemia, NL LVEF [place] stress echo [place]28[place] [place]  . Cancer ([place]    thyroid  . H/O: hysterectomy   . Hyperlipidemia   . Hypertension   . Hypothyroidism   . Lesion of left lung 11/22/13   left upper lobe  . Shortness of breath    at times     History  Smoking Status  . Never Smoker  Smokeless Tobacco  . Never Used    History  Alcohol Use No      Allergies  Allergen Reactions  . Chlorthalidone Cough  . Lipitor [Atorvastatin]     Muscle weakness  . Zocor [Simvastatin]     Muscle weakness     Current Outpatient Prescriptions  Medication Sig Dispense Refill  . amLODipine (NORVASC) 5 MG tablet Take 5 mg by mouth daily.    .Marland Kitchenaspirin EC 81 MG tablet Take 81 mg by mouth daily.    . calcium-vitamin D (OSCAL WITH D) 500-200 MG-UNIT per tablet Take 1 tablet by mouth daily with breakfast.    . colesevelam (WELCHOL) 625 MG tablet Take 1,250 mg by mouth 2 (two) times daily with a meal.     . furosemide (LASIX) 20 MG tablet Take 20 mg by mouth daily.    .Marland Kitchenlevothyroxine (SYNTHROID, LEVOTHROID) 150 MCG tablet Take 150 mcg by mouth daily before breakfast.    . Omega-3 Fatty Acids (FISH OIL) 1000 MG CAPS Take 1 capsule by mouth daily.    .Marland Kitchentelmisartan-hydrochlorothiazide (MICARDIS HCT) 80-25 MG per tablet Take 1 tablet by mouth daily.     No current facility-administered medications for this visit.        Physical Exam :BP (!) 152/86   Pulse 85   Resp 16   Ht '5\' 6"'$  (1.676 m)   Wt 185 lb 3.2  oz (84 kg)   SpO2 95% Comment: ON RA  BMI 29.89 kg/m    General appearance: alert, cooperative and no distress Heart: regular rate and rhythm,  no murmur, click, rub or gallop Lungs: clear to auscultation bilaterally Abdomen: benign exam Extremities: minor lower ext edema Wound: healing well without evidence of infection Patient has no cervical supraclavicular adenopathy  Diagnostic Studies & Laboratory data:     Recent Radiology Findings:  Ct Chest Wo Contrast  Result Date: 08/27/2016 CLINICAL DATA:  Followup lung cancer EXAM: CT CHEST WITHOUT CONTRAST TECHNIQUE: Multidetector CT imaging of the chest was performed following the standard protocol without IV contrast. COMPARISON:  02/13/2016 FINDINGS: Cardiovascular: Normal heart size. No pericardial effusion identified. Aortic atherosclerosis noted. Calcification within the LAD and  left circumflex coronary artery noted. Mediastinum/Nodes: The trachea appears patent and is midline. Normal appearance of the esophagus. No mediastinal or hilar adenopathy. Small hiatal hernia noted. Lungs/Pleura: No pleural fluid identified. Postoperative changes from left upper lobe lung resection identified. Index measurement from focal nodular thickening along the suture line measures 1.3 cm, image 26 of series 4. Previously 1.6 cm. There is noted new suspicious pulmonary nodule or mass identified. Upper Abdomen: Hyperdense lesion arising from the upper pole of the right kidney measures 9 mm and is unchanged from previous exam, image 126 of series 3. No acute findings identified within the upper abdomen. Musculoskeletal: No aggressive lytic or sclerotic bone lesions identified. IMPRESSION: 1. No acute findings. 2. Stable nodular thickening in the left upper lobe at resection site. No specific findings to suggest local tumor recurrence or metastatic disease. 3. Aortic atherosclerosis and coronary artery calcifications Electronically Signed   By: Kerby Moors M.D.   On: 08/27/2016 09:39   Ct Chest Wo Contrast  02/13/2016  CLINICAL DATA:  LEFT upper lobe surgery for lung cancer in September 2015. Subsequent treatment evaluation. EXAM: CT CHEST WITHOUT CONTRAST TECHNIQUE: Multidetector CT imaging of the chest was performed following the standard protocol without IV contrast. COMPARISON:  CT 08/22/2015 FINDINGS: Mediastinum/Nodes: No axillary supraclavicular adenopathy. No mediastinal hilar adenopathy. No pericardial fluid. Coronary artery calcifications present. Esophagus normal. Lungs/Pleura: There is linear nodular thickening in the LEFT upper lobe along the surgical resection site which is not changed compared to prior. Nodular component measures approximately 16 mm (image 11, series 4) compared to 15 mm on prior. No new nodularity. Mild ground-glass diffusely within the LEFT lower lobe is unchanged. RIGHT  lung is clear. Upper abdomen: Limited view the upper abdomen demonstrates normal adrenal glands. Stable high-density round lesion in the upper pole of the RIGHT kidney. Adjacent simple fluid density cyst appears benign. Atherosclerotic calcification aorta Musculoskeletal: Degenerate changes the spine. IMPRESSION: 1. Stable linear nodular thickening in the LEFT upper lobe at resection site. No evidence of local recurrence. 2. No adenopathy. 3. Coronary artery calcifications Electronically Signed   By: Suzy Bouchard M.D.   On: 02/13/2016 10:00    Ct Chest Wo Contrast  02/21/2015   CLINICAL DATA:  Left upper lobe cancer with lobectomy in December 2014. Prior thyroid cancer in 2013 with thyroidectomy. Productive cough.  EXAM: CT CHEST WITHOUT CONTRAST  TECHNIQUE: Multidetector CT imaging of the chest was performed following the standard protocol without IV contrast.  COMPARISON:  Multiple exams, including 06/21/2014  FINDINGS: Mediastinum/Nodes: At the thoracic inlet a lymph node adjacent to the left carotid artery measures 7 mm in short axis, image 6 series 3, upper normal size. No pathologic thoracic adenopathy identified. Left anterior descending  coronary artery stent noted. Aortic and branch vessel atherosclerotic calcification.  Lungs/Pleura: Left upper lobe wedge resections observed. There is slight lobularity along the upper margin of the wedge resection on image 8 of series 4 which could simply be due to scarring but which merits observation. No specific indicators of recurrent malignancy.  Patchy interstitial accentuation and hazy opacity peripherally in portions of both lungs but favoring the lower lobes, with underlying bronchiectasis. The possibility of early idiopathic pulmonary fibrosis is not excluded.  Upper abdomen: Small hypodense lesion in segment 4 of the liver, no change from 06/21/2014 and not previously hypermetabolic, likely a cyst, hemangioma, or similar benign lesion.  Musculoskeletal:  Mild T7 anterior wedging appears chronic.  IMPRESSION: 1. Feet scarring along the wedge resection site likely merits periodic observation, but no recurrent malignancy is identified. 2. Hazy ground-glass opacity in the lung bases with faint interstitial accentuation and and peripheral bronchiectasis -early idiopathic pulmonary fibrosis is not excluded. No overt honeycombing. 3. Atherosclerosis.   Electronically Signed   By: Van Clines M.D.   On: 02/21/2015 14:07    I have independently reviewed the above radiology studies  and reviewed the findings with the patient.   Recent Lab Findings: Lab Results  Component Value Date   WBC 5.8 08/10/2014   HGB 11.9 (L) 08/10/2014   HCT 35.9 (L) 08/10/2014   PLT 229 08/10/2014   GLUCOSE 93 08/10/2014   ALT 10 08/10/2014   AST 19 08/10/2014   NA 141 08/10/2014   K 4.1 08/10/2014   CL 102 08/10/2014   CREATININE 0.74 08/10/2014   BUN 13 08/10/2014   CO2 26 08/10/2014   INR 0.97 08/06/2014   Diagnosis 1. Lung, wedge biopsy/resection, left upper lobe - DENSELY INFLAMED LUNG PARENCHYMA. - THERE IS NO EVIDENCE OF MALIGNANCY. - SEE COMMENT. 2. Lung, wedge biopsy/resection, additional margin left upper lobe - BENIGN MILDLY INFLAMED LUNG PARENCHYMA WITH HEMORRHAGE. - THERE IS NO EVIDENCE OF MALIGNANCY. 3. Lung, wedge biopsy/resection, left upper lobe second nodule - ADENOCARCINOMA WITH A PREDOMINANT LEPIDIC GROWTH PATTERN, WELL DIFFERENTIATED, SPANNING 2.2 CM. - THE SURGICAL RESECTION MARGINS ARE NEGATIVE FOR ADENOCARCINOMA. - SEE ONCOLOGY TABLE BELOW. 4. Lymph node, biopsy, 10 L node - THERE IS NO EVIDENCE OF CARCINOMA IN 1 OF 1 LYMPH NODE (0/1). 5. Lymph node, biopsy, 4 L node - THERE IS NO EVIDENCE OF CARCINOMA IN 1 OF 1 LYMPH NODE (0/1). Microscopic Comment 1. The histologic features may be seen in a resolving pneumonia/pneumonitis. Clinical correlation is recommended. Nonetheless, malignant features are not identified. Dr. Claudette Laws has reviewed selected slides of part 1 and concurs that this is a benign lesion. 3. LUNG Specimen, including laterality: Left upper lobe Procedure: Wedge resection Specimen integrity (intact/disrupted): Previously incised Tumor site: Left upper lobe ("second nodule") Tumor focality: Unifocal Maximum tumor size (cm): 2.2 cm (gross measurement) Histologic type: Adenocarcinoma with a predominant lepidic ("in situ") growth pattern Grade: Well differentiated. Margins: Negative for adenocarcinoma Distance to closest margin (cm): At least 0.5 cm Visceral pleura invasion: Not identified 1 of    Assessment / Plan:  No evidence of recurrent carcinoma the lung- on follow-up CT scan now proximally 24  months postop, we have reviewed CT scan with her and we'll plan to see her back in 6 months with a follow-up CT scan of the chest She promises to get flu shot this week  Grace Isaac MD      [address] [place] [address] Office [phone]  Beeper [phone]  08/27/2016 10:19 AM

## 2017-01-29 ENCOUNTER — Other Ambulatory Visit: Payer: Self-pay | Admitting: Cardiothoracic Surgery

## 2017-01-29 DIAGNOSIS — R918 Other nonspecific abnormal finding of lung field: Secondary | ICD-10-CM

## 2017-03-04 ENCOUNTER — Other Ambulatory Visit: Payer: Self-pay | Admitting: *Deleted

## 2017-03-04 ENCOUNTER — Ambulatory Visit
Admission: RE | Admit: 2017-03-04 | Discharge: 2017-03-04 | Disposition: A | Discharge status: Home / Self Care | Payer: Medicare Other | Source: Ambulatory Visit | Attending: Cardiothoracic Surgery | Admitting: Cardiothoracic Surgery

## 2017-03-04 ENCOUNTER — Ambulatory Visit (INDEPENDENT_AMBULATORY_CARE_PROVIDER_SITE_OTHER): Payer: Medicare Other | Admitting: Physician Assistant

## 2017-03-04 VITALS — BP 140/83 | HR 76 | Resp 20 | Ht 66.0 in | Wt 185.0 lb

## 2017-03-04 DIAGNOSIS — R160 Hepatomegaly, not elsewhere classified: Secondary | ICD-10-CM

## 2017-03-04 DIAGNOSIS — Z09 Encounter for follow-up examination after completed treatment for conditions other than malignant neoplasm: Secondary | ICD-10-CM

## 2017-03-04 DIAGNOSIS — R918 Other nonspecific abnormal finding of lung field: Secondary | ICD-10-CM

## 2017-03-04 DIAGNOSIS — C3412 Malignant neoplasm of upper lobe, left bronchus or lung: Secondary | ICD-10-CM | POA: Diagnosis not present

## 2017-03-04 NOTE — Progress Notes (Signed)
[id number]       [place] [address]             [phone]      Nicole Ortiz [facility] Medical Record #[id number] Date of Birth: 1936/06/25  Referring: Gardiner Rhyme, MD Primary Care: Alma Friendly, MD  Chief Complaint:   POST OP FOLLOW UP 08/08/2014  OPERATIVE REPORT  PREOPERATIVE DIAGNOSIS: Left upper lobe lung lesion, mildly  hypermetabolic.  POSTOPERATIVE DIAGNOSIS: Inflammatory nodules, left upper lung x2. No  evidence of malignancy.  PROCEDURES PERFORMED: Bronchoscopy, left video-assisted thoracoscopy,  wedge resection of left upper lobe x2 and lymph node biopsy, placement  of On-Q device.  SURGEON: Lanelle Bal, MD  History of Present Illness:     Lung cancer,  left upper lobe   Primary site: Lung (Left)   Staging method: AJCC 7th Edition   Pathologic: Stage IA (T1b, N0, cM0) signed by Grace Isaac, MD on 08/10/2014  6:54 AM   Summary: Stage IA (T1b, N0, cM0)   Patient doing well after  surgery for resection of 2 lung nodules one which was found to be a stage Ia carcinoma the lung.  Patient is now 31 months post-op. The patient has has returned to normal activity. Denies sob, cough or hemoptysis. She is not limited by shortness of breath. She has no respiratory complaints, denies hemoptysis or recurrent pulmonary infections.  Past Medical History:  Diagnosis Date  . Arthritis   . CAD (coronary artery disease)    with stent placement. Pt denies CAD or stent placement; had coronary calcifications on CT-had no ischemia, NL LVEF on stress echo 12/28/13 (Dr. Otho Perl; [place])  . Cancer ([place])    thyroid  . H/O: hysterectomy   . Hyperlipidemia   . Hypertension   . Hypothyroidism   . Lesion of left lung 11/22/13   left upper lobe  . Shortness of breath    at times     History  Smoking Status  . Never Smoker  Smokeless Tobacco  . Never Used    History  Alcohol Use No     Allergies  Allergen Reactions  .  Chlorthalidone Cough  . Lipitor [Atorvastatin]     Muscle weakness  . Zocor [Simvastatin]     Muscle weakness     Current Outpatient Prescriptions  Medication Sig Dispense Refill  . amLODipine (NORVASC) 5 MG tablet Take 5 mg by mouth daily.    Marland Kitchen aspirin EC 81 MG tablet Take 81 mg by mouth daily.    . calcium-vitamin D (OSCAL WITH D) 500-200 MG-UNIT per tablet Take 1 tablet by mouth daily with breakfast.    . colesevelam (WELCHOL) 625 MG tablet Take 1,250 mg by mouth 2 (two) times daily with a meal.     . furosemide (LASIX) 20 MG tablet Take 20 mg by mouth daily.    Marland Kitchen levothyroxine (SYNTHROID, LEVOTHROID) 150 MCG tablet Take 150 mcg by mouth daily before breakfast.    . Omega-3 Fatty Acids (FISH OIL) 1000 MG CAPS Take 1 capsule by mouth daily.    Marland Kitchen telmisartan-hydrochlorothiazide (MICARDIS HCT) 80-25 MG per tablet Take 1 tablet by mouth daily.     No current facility-administered medications for this visit.        Physical Exam :BP 140/83   Pulse 76   Resp 20   Ht '5\' 6"'$  (1.676 m)   Wt 83.9 kg (185 lb)   SpO2 95% Comment: RA  BMI 29.86 kg/m  General appearance: alert, cooperative and no distress Heart: regular rate and rhythm,  no murmur, click, rub or gallop Lungs: clear to auscultation bilaterally Abdomen: benign exam Extremities: minor lower ext edema Wound: wound healed Patient has no cervical supraclavicular adenopathy  Diagnostic Studies & Laboratory data:     Recent Radiology Findings:   CLINICAL DATA:  Followup lung nodules.  EXAM: CT CHEST WITHOUT CONTRAST  TECHNIQUE: Multidetector CT imaging of the chest was performed following the standard protocol without IV contrast.  COMPARISON:  08/27/2016.  FINDINGS: Cardiovascular: There is moderate cardiac enlargement. Aortic atherosclerosis identified. Calcification in the LAD and left circumflex coronary artery is noted. No pericardial effusion.  Mediastinum/Nodes: The trachea appears patent and  is midline. Normal appearance of the esophagus. No enlarged mediastinal or hilar lymph nodes.  Lungs/Pleura: Mild changes of centrilobular emphysema. Postsurgical change in the left upper lobe is again identified. Index measurement within this area is 1.3 cm transverse, image 22 of series 4. Previously this measured the same. No new or enlarging pulmonary nodules or masses.  Upper Abdomen: Near the dome of liver there is an indeterminate intermediate attenuating structure measuring 1.8 cm, image 95 of series 3. On study from 02/21/2015 this measured 1 cm. On CT from 02/13/2016 this measured 1.1 cm. Similar appearance of complex hyperdense cyst arising from the upper pole of the right kidney which is incompletely characterized without IV contrast.  Musculoskeletal: No aggressive lytic or sclerotic bone lesions identified. Stable mild chronic compression deformity within the mid thoracic spine, image 17 of series 602.  IMPRESSION: 1. Stable appearance of the chest. Postsurgical change within the left upper lobe with focal nodular thickening along the suture line is unchanged. No new or progressive findings in the chest. 2. There is a subtle, intermediate attenuating structure new the dome of liver which in retrospect has gradually increased in size from 02/21/2015. This is incompletely characterized, but is favored to represent a slow-growing indolent process. More definitive assessment of this structure in this patient who is at increased risk is advised with contrast enhanced liver protocol MRI or CT.   Electronically Signed   By: Kerby Moors M.D.   On: 03/04/2017 10:52  Ct Chest Wo Contrast  Result Date: 08/27/2016 CLINICAL DATA:  Followup lung cancer EXAM: CT CHEST WITHOUT CONTRAST TECHNIQUE: Multidetector CT imaging of the chest was performed following the standard protocol without IV contrast. COMPARISON:  02/13/2016 FINDINGS: Cardiovascular: Normal heart size. No  pericardial effusion identified. Aortic atherosclerosis noted. Calcification within the LAD and left circumflex coronary artery noted. Mediastinum/Nodes: The trachea appears patent and is midline. Normal appearance of the esophagus. No mediastinal or hilar adenopathy. Small hiatal hernia noted. Lungs/Pleura: No pleural fluid identified. Postoperative changes from left upper lobe lung resection identified. Index measurement from focal nodular thickening along the suture line measures 1.3 cm, image 26 of series 4. Previously 1.6 cm. There is noted new suspicious pulmonary nodule or mass identified. Upper Abdomen: Hyperdense lesion arising from the upper pole of the right kidney measures 9 mm and is unchanged from previous exam, image 126 of series 3. No acute findings identified within the upper abdomen. Musculoskeletal: No aggressive lytic or sclerotic bone lesions identified. IMPRESSION: 1. No acute findings. 2. Stable nodular thickening in the left upper lobe at resection site. No specific findings to suggest local tumor recurrence or metastatic disease. 3. Aortic atherosclerosis and coronary artery calcifications Electronically Signed   By: Kerby Moors M.D.   On: 08/27/2016 09:39   Ct  Chest Wo Contrast  02/13/2016  CLINICAL DATA:  LEFT upper lobe surgery for lung cancer in September 2015. Subsequent treatment evaluation. EXAM: CT CHEST WITHOUT CONTRAST TECHNIQUE: Multidetector CT imaging of the chest was performed following the standard protocol without IV contrast. COMPARISON:  CT 08/22/2015 FINDINGS: Mediastinum/Nodes: No axillary supraclavicular adenopathy. No mediastinal hilar adenopathy. No pericardial fluid. Coronary artery calcifications present. Esophagus normal. Lungs/Pleura: There is linear nodular thickening in the LEFT upper lobe along the surgical resection site which is not changed compared to prior. Nodular component measures approximately 16 mm (image 11, series 4) compared to 15 mm on prior.  No new nodularity. Mild ground-glass diffusely within the LEFT lower lobe is unchanged. RIGHT lung is clear. Upper abdomen: Limited view the upper abdomen demonstrates normal adrenal glands. Stable high-density round lesion in the upper pole of the RIGHT kidney. Adjacent simple fluid density cyst appears benign. Atherosclerotic calcification aorta Musculoskeletal: Degenerate changes the spine. IMPRESSION: 1. Stable linear nodular thickening in the LEFT upper lobe at resection site. No evidence of local recurrence. 2. No adenopathy. 3. Coronary artery calcifications Electronically Signed   By: Suzy Bouchard M.D.   On: 02/13/2016 10:00    Ct Chest Wo Contrast  02/21/2015   CLINICAL DATA:  Left upper lobe cancer with lobectomy in December 2014. Prior thyroid cancer in 2013 with thyroidectomy. Productive cough.  EXAM: CT CHEST WITHOUT CONTRAST  TECHNIQUE: Multidetector CT imaging of the chest was performed following the standard protocol without IV contrast.  COMPARISON:  Multiple exams, including 06/21/2014  FINDINGS: Mediastinum/Nodes: At the thoracic inlet a lymph node adjacent to the left carotid artery measures 7 mm in short axis, image 6 series 3, upper normal size. No pathologic thoracic adenopathy identified. Left anterior descending coronary artery stent noted. Aortic and branch vessel atherosclerotic calcification.  Lungs/Pleura: Left upper lobe wedge resections observed. There is slight lobularity along the upper margin of the wedge resection on image 8 of series 4 which could simply be due to scarring but which merits observation. No specific indicators of recurrent malignancy.  Patchy interstitial accentuation and hazy opacity peripherally in portions of both lungs but favoring the lower lobes, with underlying bronchiectasis. The possibility of early idiopathic pulmonary fibrosis is not excluded.  Upper abdomen: Small hypodense lesion in segment 4 of the liver, no change from 06/21/2014 and not  previously hypermetabolic, likely a cyst, hemangioma, or similar benign lesion.  Musculoskeletal: Mild T7 anterior wedging appears chronic.  IMPRESSION: 1. Feet scarring along the wedge resection site likely merits periodic observation, but no recurrent malignancy is identified. 2. Hazy ground-glass opacity in the lung bases with faint interstitial accentuation and and peripheral bronchiectasis -early idiopathic pulmonary fibrosis is not excluded. No overt honeycombing. 3. Atherosclerosis.   Electronically Signed   By: Van Clines M.D.   On: 02/21/2015 14:07    I have independently reviewed the above radiology studies  and reviewed the findings with the patient.   Recent Lab Findings: Lab Results  Component Value Date   WBC 5.8 08/10/2014   HGB 11.9 (L) 08/10/2014   HCT 35.9 (L) 08/10/2014   PLT 229 08/10/2014   GLUCOSE 93 08/10/2014   ALT 10 08/10/2014   AST 19 08/10/2014   NA 141 08/10/2014   K 4.1 08/10/2014   CL 102 08/10/2014   CREATININE 0.74 08/10/2014   BUN 13 08/10/2014   CO2 26 08/10/2014   INR 0.97 08/06/2014   Diagnosis 1. Lung, wedge biopsy/resection, left upper lobe - DENSELY INFLAMED  LUNG PARENCHYMA. - THERE IS NO EVIDENCE OF MALIGNANCY. - SEE COMMENT. 2. Lung, wedge biopsy/resection, additional margin left upper lobe - BENIGN MILDLY INFLAMED LUNG PARENCHYMA WITH HEMORRHAGE. - THERE IS NO EVIDENCE OF MALIGNANCY. 3. Lung, wedge biopsy/resection, left upper lobe second nodule - ADENOCARCINOMA WITH A PREDOMINANT LEPIDIC GROWTH PATTERN, WELL DIFFERENTIATED, SPANNING 2.2 CM. - THE SURGICAL RESECTION MARGINS ARE NEGATIVE FOR ADENOCARCINOMA. - SEE ONCOLOGY TABLE BELOW. 4. Lymph node, biopsy, 10 L node - THERE IS NO EVIDENCE OF CARCINOMA IN 1 OF 1 LYMPH NODE (0/1). 5. Lymph node, biopsy, 4 L node - THERE IS NO EVIDENCE OF CARCINOMA IN 1 OF 1 LYMPH NODE (0/1). Microscopic Comment 1. The histologic features may be seen in a resolving pneumonia/pneumonitis.  Clinical correlation is recommended. Nonetheless, malignant features are not identified. Dr. Claudette Laws has reviewed selected slides of part 1 and concurs that this is a benign lesion. 3. LUNG Specimen, including laterality: Left upper lobe Procedure: Wedge resection Specimen integrity (intact/disrupted): Previously incised Tumor site: Left upper lobe ("second nodule") Tumor focality: Unifocal Maximum tumor size (cm): 2.2 cm (gross measurement) Histologic type: Adenocarcinoma with a predominant lepidic ("in situ") growth pattern Grade: Well differentiated. Margins: Negative for adenocarcinoma Distance to closest margin (cm): At least 0.5 cm Visceral pleura invasion: Not identified 1 of    Assessment / Plan:  No evidence of recurrent carcinoma of the lung- on follow-up CT scan now proximally 30 months postop, we have reviewed CT scan with her and we'll plan to see her back in 6 months with a follow-up CT scan of the chest. MRI of the liver scheduled for next week due to a subtle, intermediate attenuating structure new the dome of liver.   I reviewed the CT scan with Dr. Servando Snare. He has seen the patient and agrees with the plan stated above.     Nicole Rough, PA-C      [address] [place] [address] Office [phone]   Beeper [phone]  03/04/2017 11:15 AM

## 2017-03-04 NOTE — Patient Instructions (Addendum)
Please follow-up in 6 months for another chest CT.  MRI of the liver scheduled next week.  Please call our office if there are any questions.

## 2017-03-22 ENCOUNTER — Ambulatory Visit
Admission: RE | Admit: 2017-03-22 | Discharge: 2017-03-22 | Disposition: A | Discharge status: Home / Self Care | Payer: Medicare Other | Source: Ambulatory Visit | Attending: Cardiothoracic Surgery | Admitting: Cardiothoracic Surgery

## 2017-03-22 DIAGNOSIS — R160 Hepatomegaly, not elsewhere classified: Secondary | ICD-10-CM

## 2017-03-22 MED ORDER — GADOBENATE DIMEGLUMINE 529 MG/ML IV SOLN
16.0000 mL | Freq: Once | INTRAVENOUS | Status: AC | PRN
  Administered 2017-03-22: 16 mL via INTRAVENOUS

## 2017-03-25 ENCOUNTER — Other Ambulatory Visit: Payer: Self-pay | Admitting: *Deleted

## 2017-03-25 DIAGNOSIS — K769 Liver disease, unspecified: Secondary | ICD-10-CM

## 2017-03-29 ENCOUNTER — Other Ambulatory Visit: Payer: Self-pay | Admitting: Cardiothoracic Surgery

## 2017-03-29 DIAGNOSIS — C3490 Malignant neoplasm of unspecified part of unspecified bronchus or lung: Secondary | ICD-10-CM

## 2017-04-05 ENCOUNTER — Ambulatory Visit (HOSPITAL_COMMUNITY)
Admission: RE | Admit: 2017-04-05 | Discharge: 2017-04-05 | Disposition: A | Discharge status: Home / Self Care | Payer: Medicare Other | Source: Ambulatory Visit | Attending: Cardiothoracic Surgery | Admitting: Cardiothoracic Surgery

## 2017-04-05 DIAGNOSIS — I517 Cardiomegaly: Secondary | ICD-10-CM | POA: Insufficient documentation

## 2017-04-05 DIAGNOSIS — Z9889 Other specified postprocedural states: Secondary | ICD-10-CM | POA: Diagnosis not present

## 2017-04-05 DIAGNOSIS — K769 Liver disease, unspecified: Secondary | ICD-10-CM | POA: Insufficient documentation

## 2017-04-05 DIAGNOSIS — I700 Atherosclerosis of aorta: Secondary | ICD-10-CM | POA: Diagnosis not present

## 2017-04-05 DIAGNOSIS — I2510 Atherosclerotic heart disease of native coronary artery without angina pectoris: Secondary | ICD-10-CM | POA: Insufficient documentation

## 2017-04-05 DIAGNOSIS — N281 Cyst of kidney, acquired: Secondary | ICD-10-CM | POA: Insufficient documentation

## 2017-04-05 DIAGNOSIS — Z90710 Acquired absence of both cervix and uterus: Secondary | ICD-10-CM | POA: Diagnosis not present

## 2017-04-05 DIAGNOSIS — R921 Mammographic calcification found on diagnostic imaging of breast: Secondary | ICD-10-CM | POA: Insufficient documentation

## 2017-04-05 DIAGNOSIS — C3490 Malignant neoplasm of unspecified part of unspecified bronchus or lung: Secondary | ICD-10-CM | POA: Insufficient documentation

## 2017-04-05 LAB — GLUCOSE, CAPILLARY: Glucose-Capillary: 91 mg/dL (ref 65–99)

## 2017-04-05 MED ORDER — FLUDEOXYGLUCOSE F - 18 (FDG) INJECTION
9.1000 | Freq: Once | INTRAVENOUS | Status: AC | PRN
  Administered 2017-04-05: 9.1 via INTRAVENOUS

## 2017-08-17 ENCOUNTER — Other Ambulatory Visit: Payer: Self-pay | Admitting: Cardiothoracic Surgery

## 2017-08-17 DIAGNOSIS — R911 Solitary pulmonary nodule: Secondary | ICD-10-CM

## 2017-09-01 IMAGING — CT NM PET TUM IMG INITIAL (PI) SKULL BASE T - THIGH
1 of 8 series · 1 of 25 positions shown · non-contrast
Comparison: Chest CT including 03/04/2017. Abdominal MRI of
03/22/2017.

CLINICAL DATA: Initial treatment strategy for prior left upper lobe
wedge resection for stage IA primary bronchogenic carcinoma. Recent
CT and MRI demonstrating liver lesion.

EXAM:
NUCLEAR MEDICINE PET SKULL BASE TO THIGH
TECHNIQUE: 9.1 mCi F-18 FDG was injected intravenously. Full-ring PET imaging
was performed from the skull base to thigh after the radiotracer. CT
data was obtained and used for attenuation correction and anatomic
localization.
FASTING BLOOD GLUCOSE:  Value: 91 mg/dl

[Series 4: ct sk_thigh 5.0 b31f · axial · 5.0mm · 0.98mm/px · 1 of 222 slices shown]
[im 222/222  brain]
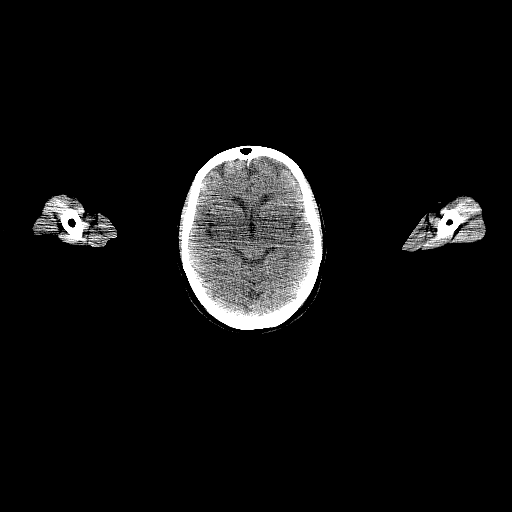

[1 of 25 positions shown; findings below may reference images not displayed]

FINDINGS: NECK

No areas of abnormal hypermetabolism. No cervical adenopathy.
Thyroidectomy.

CHEST

Low-level hypermetabolism about the area of postoperative scarring
in the left upper lobe. This measures a S.U.V. max of 1.9, including
on image 18/series 8. There is also low-level hypermetabolism about
the inferolateral left pleural space which is likely postoperative.
No well-defined pleural mass. No thoracic nodal hypermetabolism.
Dystrophic calcification in the left breast. Mild cardiomegaly with
multivessel coronary artery atherosclerosis.

ABDOMEN/PELVIS

No hypermetabolism to correspond to the hypoattenuating left hepatic
dome lesion. This measures 1.6 cm on image 90/series 4. When
compared back to the outside CT of 06/07/2014, this measured on the
order of 1.1 cm (when remeasured). No abdominopelvic nodal
hypermetabolism. An upper pole left renal hyperattenuating lesion
measures 8 mm and is favored to represent a complex cyst on the
prior MRI. 7.6 cm lower pole right renal cyst. Abdominal aortic and
branch vessel atherosclerosis. Left common iliac artery ectasia at
1.5 cm. Tip of the cecum is positioned at the entrance to the right
inguinal canal. Hysterectomy.

SKELETON

No abnormal marrow activity. No focal osseous lesion.
IMPRESSION: 1. No hypermetabolism to correspond to the high left hepatic lobe
lesion detailed on prior CT and MRI. This lack of significant
activity, and the presence of this lesion back on 06/07/2014,
suggest an indolent process, most likely benign. If the patient is
scheduled for follow-up chest CTs, this could be re-evaluated at
that point. Alternatively, follow-up pre and post contrast abdominal
MRI at 6 months should be considered.
2. Postoperative changes in the left upper lobe.
3.  Coronary artery atherosclerosis. Aortic atherosclerosis.

## 2017-09-02 ENCOUNTER — Ambulatory Visit
Admission: RE | Admit: 2017-09-02 | Discharge: 2017-09-02 | Disposition: A | Discharge status: Home / Self Care | Payer: Medicare Other | Source: Ambulatory Visit | Attending: Cardiothoracic Surgery | Admitting: Cardiothoracic Surgery

## 2017-09-02 ENCOUNTER — Encounter: Payer: Self-pay | Admitting: Cardiothoracic Surgery

## 2017-09-02 ENCOUNTER — Ambulatory Visit (INDEPENDENT_AMBULATORY_CARE_PROVIDER_SITE_OTHER): Payer: Medicare Other | Admitting: Cardiothoracic Surgery

## 2017-09-02 VITALS — BP 140/85 | HR 75 | Ht 66.0 in | Wt 186.0 lb

## 2017-09-02 DIAGNOSIS — Z09 Encounter for follow-up examination after completed treatment for conditions other than malignant neoplasm: Secondary | ICD-10-CM | POA: Diagnosis not present

## 2017-09-02 DIAGNOSIS — R911 Solitary pulmonary nodule: Secondary | ICD-10-CM

## 2017-09-02 DIAGNOSIS — C3412 Malignant neoplasm of upper lobe, left bronchus or lung: Secondary | ICD-10-CM

## 2017-09-02 NOTE — Progress Notes (Signed)
[address]       [place] ,[place] [address]             [phone]      Madeeha S Stopper [facility] Medical Record #[id number] Date of Birth: 1936/01/12  Referring: Gardiner Rhyme, MD Primary Care: Alma Friendly, MD  Chief Complaint:   POST OP FOLLOW UP 08/08/2014  OPERATIVE REPORT  PREOPERATIVE DIAGNOSIS: Left upper lobe lung lesion, mildly  hypermetabolic.  POSTOPERATIVE DIAGNOSIS: Inflammatory nodules, left upper lung x2. No  evidence of malignancy.  PROCEDURES PERFORMED: Bronchoscopy, left video-assisted thoracoscopy,  wedge resection of left upper lobe x2 and lymph node biopsy, placement  of On-Q device.  SURGEON: Lanelle Bal, MD  History of Present Illness:     Lung cancer,  left upper lobe   Primary site: Lung (Left)   Staging method: AJCC 7th Edition   Pathologic: Stage IA (T1b, N0, cM0) signed by Grace Isaac, MD on 08/10/2014  6:54 AM   Summary: Stage IA (T1b, N0, cM0)  Patient returns today for follow-up after surgical resection of 2 lung nodules which were found to be stage IA, patient was originally operated on September 2015, now just over 3 years postop. She is not limited by shortness of breath denies hemoptysis. Flu vaccination is not up-to-date  Past Medical History:  Diagnosis Date  . Arthritis   . CAD (coronary artery disease)    with stent placement. Pt denies CAD or stent placement; had coronary calcifications on CT-had no ischemia, NL LVEF on stress echo 12/28/13 (Dr. Otho Perl; [place])  . Cancer ([place])    thyroid  . H/O: hysterectomy   . Hyperlipidemia   . Hypertension   . Hypothyroidism   . Lesion of left lung 11/22/13   left upper lobe  . Shortness of breath    at times     History  Smoking Status  . Never Smoker  Smokeless Tobacco  . Never Used    History  Alcohol Use No     Allergies  Allergen Reactions  . Ace Inhibitors     cough  . Chlorthalidone Cough  . Lipitor [Atorvastatin]     Muscle  weakness  . Zocor [Simvastatin]     Muscle weakness     Current Outpatient Prescriptions  Medication Sig Dispense Refill  . amLODipine (NORVASC) 5 MG tablet Take 5 mg by mouth daily.    Marland Kitchen aspirin EC 81 MG tablet Take 81 mg by mouth daily.    . calcium-vitamin D (OSCAL WITH D) 500-200 MG-UNIT per tablet Take 1 tablet by mouth daily with breakfast.    . colesevelam (WELCHOL) 625 MG tablet Take 1,250 mg by mouth 2 (two) times daily with a meal.     . furosemide (LASIX) 20 MG tablet Take 20 mg by mouth daily.    Marland Kitchen levothyroxine (SYNTHROID, LEVOTHROID) 150 MCG tablet Take 150 mcg by mouth daily before breakfast.    . Omega-3 Fatty Acids (FISH OIL) 1000 MG CAPS Take 1 capsule by mouth daily.    Marland Kitchen telmisartan-hydrochlorothiazide (MICARDIS HCT) 80-25 MG per tablet Take 1 tablet by mouth daily.     No current facility-administered medications for this visit.        Physical Exam :BP 140/85   Pulse 75   Ht 5\' 6"  (1.676 m)   Wt 186 lb (84.4 kg)   SpO2 95%   BMI 30.02 kg/m    General appearance:Alert oriented without distress normal affect Heart: Regular  rate and rhythm at without murmur gallop Lungs: Lungs clear bilaterally Abdomen: Abdomen is soft nontender without palpable masses Extremities: minor lower ext edema Wound: Wounds healed well Patient has no cervical supraclavicular or axillary adenopathy on palpation  Diagnostic Studies & Laboratory data:     Recent Radiology Findings:  Ct Chest Wo Contrast  Result Date: 09/02/2017 CLINICAL DATA:  Followup left upper lobe lung carcinoma. Followup left upper lobe opacity adjacent suture line. Status post thyroidectomy for thyroid carcinoma. EXAM: CT CHEST WITHOUT CONTRAST TECHNIQUE: Multidetector CT imaging of the chest was performed following the standard protocol without IV contrast. COMPARISON:  03/04/2017 FINDINGS: Cardiovascular: No acute findings. Aortic and coronary artery atherosclerosis. Mediastinum/Nodes: No masses or  pathologically enlarged lymph nodes identified on this unenhanced exam. Lungs/Pleura: Stable left upper lobe scarring from previous wedge resection. No associated mass identified. No evidence acute infiltrate or pleural effusion. Mild emphysema and bilateral lower lobe bronchiectasis again noted. Upper Abdomen: Normal adrenal glands. Stable small hemorrhagic and simple cysts in upper pole of right kidney. Small hiatal hernia again noted. Musculoskeletal:  No suspicious bone lesions. IMPRESSION: Stable left upper lobe postoperative changes from previous wedge resection. No evidence of recurrent or metastatic carcinoma within the thorax. Stable mild bilateral lower lobe bronchiectasis. Stable small hiatal hernia. Aortic Atherosclerosis (ICD10-I70.0) and Emphysema (ICD10-J43.9). Electronically Signed   By: Earle Gell M.D.   On: 09/02/2017 11:30   CLINICAL DATA:  Followup lung nodules.  EXAM: CT CHEST WITHOUT CONTRAST  TECHNIQUE: Multidetector CT imaging of the chest was performed following the standard protocol without IV contrast.  COMPARISON:  08/27/2016.  FINDINGS: Cardiovascular: There is moderate cardiac enlargement. Aortic atherosclerosis identified. Calcification in the LAD and left circumflex coronary artery is noted. No pericardial effusion.  Mediastinum/Nodes: The trachea appears patent and is midline. Normal appearance of the esophagus. No enlarged mediastinal or hilar lymph nodes.  Lungs/Pleura: Mild changes of centrilobular emphysema. Postsurgical change in the left upper lobe is again identified. Index measurement within this area is 1.3 cm transverse, image 22 of series 4. Previously this measured the same. No new or enlarging pulmonary nodules or masses.  Upper Abdomen: Near the dome of liver there is an indeterminate intermediate attenuating structure measuring 1.8 cm, image 95 of series 3. On study from 02/21/2015 this measured 1 cm. On CT from 02/13/2016 this  measured 1.1 cm. Similar appearance of complex hyperdense cyst arising from the upper pole of the right kidney which is incompletely characterized without IV contrast.  Musculoskeletal: No aggressive lytic or sclerotic bone lesions identified. Stable mild chronic compression deformity within the mid thoracic spine, image 17 of series 602.  IMPRESSION: 1. Stable appearance of the chest. Postsurgical change within the left upper lobe with focal nodular thickening along the suture line is unchanged. No new or progressive findings in the chest. 2. There is a subtle, intermediate attenuating structure new the dome of liver which in retrospect has gradually increased in size from 02/21/2015. This is incompletely characterized, but is favored to represent a slow-growing indolent process. More definitive assessment of this structure in this patient who is at increased risk is advised with contrast enhanced liver protocol MRI or CT.   Electronically Signed   By: Kerby Moors M.D.   On: 03/04/2017 10:52  Ct Chest Wo Contrast  Result Date: 08/27/2016 CLINICAL DATA:  Followup lung cancer EXAM: CT CHEST WITHOUT CONTRAST TECHNIQUE: Multidetector CT imaging of the chest was performed following the standard protocol without IV contrast. COMPARISON:  02/13/2016  FINDINGS: Cardiovascular: Normal heart size. No pericardial effusion identified. Aortic atherosclerosis noted. Calcification within the LAD and left circumflex coronary artery noted. Mediastinum/Nodes: The trachea appears patent and is midline. Normal appearance of the esophagus. No mediastinal or hilar adenopathy. Small hiatal hernia noted. Lungs/Pleura: No pleural fluid identified. Postoperative changes from left upper lobe lung resection identified. Index measurement from focal nodular thickening along the suture line measures 1.3 cm, image 26 of series 4. Previously 1.6 cm. There is noted new suspicious pulmonary nodule or mass identified.  Upper Abdomen: Hyperdense lesion arising from the upper pole of the right kidney measures 9 mm and is unchanged from previous exam, image 126 of series 3. No acute findings identified within the upper abdomen. Musculoskeletal: No aggressive lytic or sclerotic bone lesions identified. IMPRESSION: 1. No acute findings. 2. Stable nodular thickening in the left upper lobe at resection site. No specific findings to suggest local tumor recurrence or metastatic disease. 3. Aortic atherosclerosis and coronary artery calcifications Electronically Signed   By: Kerby Moors M.D.   On: 08/27/2016 09:39   Ct Chest Wo Contrast  02/13/2016  CLINICAL DATA:  LEFT upper lobe surgery for lung cancer in September 2015. Subsequent treatment evaluation. EXAM: CT CHEST WITHOUT CONTRAST TECHNIQUE: Multidetector CT imaging of the chest was performed following the standard protocol without IV contrast. COMPARISON:  CT 08/22/2015 FINDINGS: Mediastinum/Nodes: No axillary supraclavicular adenopathy. No mediastinal hilar adenopathy. No pericardial fluid. Coronary artery calcifications present. Esophagus normal. Lungs/Pleura: There is linear nodular thickening in the LEFT upper lobe along the surgical resection site which is not changed compared to prior. Nodular component measures approximately 16 mm (image 11, series 4) compared to 15 mm on prior. No new nodularity. Mild ground-glass diffusely within the LEFT lower lobe is unchanged. RIGHT lung is clear. Upper abdomen: Limited view the upper abdomen demonstrates normal adrenal glands. Stable high-density round lesion in the upper pole of the RIGHT kidney. Adjacent simple fluid density cyst appears benign. Atherosclerotic calcification aorta Musculoskeletal: Degenerate changes the spine. IMPRESSION: 1. Stable linear nodular thickening in the LEFT upper lobe at resection site. No evidence of local recurrence. 2. No adenopathy. 3. Coronary artery calcifications Electronically Signed   By:  Suzy Bouchard M.D.   On: 02/13/2016 10:00    Ct Chest Wo Contrast  02/21/2015   CLINICAL DATA:  Left upper lobe cancer with lobectomy in December 2014. Prior thyroid cancer in 2013 with thyroidectomy. Productive cough.  EXAM: CT CHEST WITHOUT CONTRAST  TECHNIQUE: Multidetector CT imaging of the chest was performed following the standard protocol without IV contrast.  COMPARISON:  Multiple exams, including 06/21/2014  FINDINGS: Mediastinum/Nodes: At the thoracic inlet a lymph node adjacent to the left carotid artery measures 7 mm in short axis, image 6 series 3, upper normal size. No pathologic thoracic adenopathy identified. Left anterior descending coronary artery stent noted. Aortic and branch vessel atherosclerotic calcification.  Lungs/Pleura: Left upper lobe wedge resections observed. There is slight lobularity along the upper margin of the wedge resection on image 8 of series 4 which could simply be due to scarring but which merits observation. No specific indicators of recurrent malignancy.  Patchy interstitial accentuation and hazy opacity peripherally in portions of both lungs but favoring the lower lobes, with underlying bronchiectasis. The possibility of early idiopathic pulmonary fibrosis is not excluded.  Upper abdomen: Small hypodense lesion in segment 4 of the liver, no change from 06/21/2014 and not previously hypermetabolic, likely a cyst, hemangioma, or similar benign lesion.  Musculoskeletal:  Mild T7 anterior wedging appears chronic.  IMPRESSION: 1. Feet scarring along the wedge resection site likely merits periodic observation, but no recurrent malignancy is identified. 2. Hazy ground-glass opacity in the lung bases with faint interstitial accentuation and and peripheral bronchiectasis -early idiopathic pulmonary fibrosis is not excluded. No overt honeycombing. 3. Atherosclerosis.   Electronically Signed   By: Van Clines M.D.   On: 02/21/2015 14:07    I have independently reviewed  the above radiology studies  and reviewed the findings with the patient.   Recent Lab Findings: Lab Results  Component Value Date   WBC 5.8 08/10/2014   HGB 11.9 (L) 08/10/2014   HCT 35.9 (L) 08/10/2014   PLT 229 08/10/2014   GLUCOSE 93 08/10/2014   ALT 10 08/10/2014   AST 19 08/10/2014   NA 141 08/10/2014   K 4.1 08/10/2014   CL 102 08/10/2014   CREATININE 0.74 08/10/2014   BUN 13 08/10/2014   CO2 26 08/10/2014   INR 0.97 08/06/2014   Diagnosis 1. Lung, wedge biopsy/resection, left upper lobe - DENSELY INFLAMED LUNG PARENCHYMA. - THERE IS NO EVIDENCE OF MALIGNANCY. - SEE COMMENT. 2. Lung, wedge biopsy/resection, additional margin left upper lobe - BENIGN MILDLY INFLAMED LUNG PARENCHYMA WITH HEMORRHAGE. - THERE IS NO EVIDENCE OF MALIGNANCY. 3. Lung, wedge biopsy/resection, left upper lobe second nodule - ADENOCARCINOMA WITH A PREDOMINANT LEPIDIC GROWTH PATTERN, WELL DIFFERENTIATED, SPANNING 2.2 CM. - THE SURGICAL RESECTION MARGINS ARE NEGATIVE FOR ADENOCARCINOMA. - SEE ONCOLOGY TABLE BELOW. 4. Lymph node, biopsy, 10 L node - THERE IS NO EVIDENCE OF CARCINOMA IN 1 OF 1 LYMPH NODE (0/1). 5. Lymph node, biopsy, 4 L node - THERE IS NO EVIDENCE OF CARCINOMA IN 1 OF 1 LYMPH NODE (0/1). Microscopic Comment 1. The histologic features may be seen in a resolving pneumonia/pneumonitis. Clinical correlation is recommended. Nonetheless, malignant features are not identified. Dr. Claudette Laws has reviewed selected slides of part 1 and concurs that this is a benign lesion. 3. LUNG Specimen, including laterality: Left upper lobe Procedure: Wedge resection Specimen integrity (intact/disrupted): Previously incised Tumor site: Left upper lobe ("second nodule") Tumor focality: Unifocal Maximum tumor size (cm): 2.2 cm (gross measurement) Histologic type: Adenocarcinoma with a predominant lepidic ("in situ") growth pattern Grade: Well differentiated. Margins: Negative for  adenocarcinoma Distance to closest margin (cm): At least 0.5 cm Visceral pleura invasion: Not identified 1 of    Assessment / Plan:  Stable after resection of left lung lesion 3 years ago without evidence of recurrence Plan repeat CT scan one year  Grace Isaac MD      [address] [place] [address] Office [phone]   [place]

## 2017-09-15 ENCOUNTER — Ambulatory Visit: Payer: Medicare Other | Admitting: Surgery

## 2017-09-16 ENCOUNTER — Ambulatory Visit: Payer: Medicare Other | Admitting: Cardiothoracic Surgery

## 2018-08-05 ENCOUNTER — Other Ambulatory Visit: Payer: Self-pay | Admitting: Cardiothoracic Surgery

## 2018-08-05 DIAGNOSIS — C3490 Malignant neoplasm of unspecified part of unspecified bronchus or lung: Secondary | ICD-10-CM

## 2018-08-05 NOTE — Progress Notes (Unsigned)
c 

## 2018-09-01 ENCOUNTER — Ambulatory Visit: Payer: Medicare Other | Admitting: Cardiothoracic Surgery

## 2018-09-08 ENCOUNTER — Ambulatory Visit
Admission: RE | Admit: 2018-09-08 | Discharge: 2018-09-08 | Disposition: A | Discharge status: Home / Self Care | Payer: Medicare Other | Source: Ambulatory Visit | Attending: Cardiothoracic Surgery | Admitting: Cardiothoracic Surgery

## 2018-09-08 ENCOUNTER — Ambulatory Visit: Payer: Medicare Other | Admitting: Cardiothoracic Surgery

## 2018-09-08 VITALS — BP 140/90 | HR 87 | Resp 20 | Ht 66.0 in | Wt 186.0 lb

## 2018-09-08 DIAGNOSIS — Z85118 Personal history of other malignant neoplasm of bronchus and lung: Secondary | ICD-10-CM

## 2018-09-08 DIAGNOSIS — C3490 Malignant neoplasm of unspecified part of unspecified bronchus or lung: Secondary | ICD-10-CM

## 2018-09-08 NOTE — Progress Notes (Signed)
[address]       [place] [address]             [phone]      Meeah S Wooden [place] Medical Record #[id number] Date of Birth: Feb 24, 1936  Referring: Gardiner Rhyme, MD Primary Care: Alma Friendly, MD  Chief Complaint:   POST OP FOLLOW UP 08/08/2014  OPERATIVE REPORT  PREOPERATIVE DIAGNOSIS: Left upper lobe lung lesion, mildly  hypermetabolic.  POSTOPERATIVE DIAGNOSIS: Inflammatory nodules, left upper lung x2. No  evidence of malignancy.  PROCEDURES PERFORMED: Bronchoscopy, left video-assisted thoracoscopy,  wedge resection of left upper lobe x2 and lymph node biopsy, placement  of On-Q device.  SURGEON: Lanelle Bal, MD  History of Present Illness:     Lung cancer,  left upper lobe   Primary site: Lung (Left)   Staging method: AJCC 7th Edition   Pathologic: Stage IA (T1b, N0, cM0) signed by Grace Isaac, MD on 08/10/2014  6:54 AM   Summary: Stage IA (T1b, N0, cM0)  Patient returns today for follow-up after surgical resection of 2 lung nodules which were found to be stage IA, patient was originally operated on September 2015, patient has had no respiratory complaints.  She is a lifelong non-smoker and continues to be so.  She was encouraged to get her flu vaccination this year  Past Medical History:  Diagnosis Date  . Arthritis   . CAD (coronary artery disease)    with stent placement. Pt denies CAD or stent placement; had coronary calcifications on CT-had no ischemia, NL LVEF on stress echo 12/28/13 (Dr. Otho Perl; [facility])  . Cancer ([place])    thyroid  . H/O: hysterectomy   . Hyperlipidemia   . Hypertension   . Hypothyroidism   . Lesion of left lung 11/22/13   left upper lobe  . Shortness of breath    at times     Social History   Tobacco Use  Smoking Status Never Smoker  Smokeless Tobacco Never Used    Social History   Substance and Sexual Activity  Alcohol Use No  . Alcohol/week: 0.0 standard drinks      Allergies  Allergen Reactions  . Ace Inhibitors     cough  . Chlorthalidone Cough  . Lipitor [Atorvastatin]     Muscle weakness  . Zocor [Simvastatin]     Muscle weakness     Current Outpatient Medications  Medication Sig Dispense Refill  . amLODipine (NORVASC) 5 MG tablet Take 5 mg by mouth daily.    Marland Kitchen aspirin EC 81 MG tablet Take 81 mg by mouth daily.    . calcium-vitamin D (OSCAL WITH D) 500-200 MG-UNIT per tablet Take 1 tablet by mouth daily with breakfast.    . colesevelam (WELCHOL) 625 MG tablet Take 1,250 mg by mouth 2 (two) times daily with a meal.     . furosemide (LASIX) 20 MG tablet Take 20 mg by mouth daily.    Marland Kitchen levothyroxine (SYNTHROID, LEVOTHROID) 150 MCG tablet Take 150 mcg by mouth daily before breakfast.    . Omega-3 Fatty Acids (FISH OIL) 1000 MG CAPS Take 1 capsule by mouth daily.    Marland Kitchen telmisartan-hydrochlorothiazide (MICARDIS HCT) 80-25 MG per tablet Take 1 tablet by mouth daily.    . TURMERIC PO Take by mouth daily.     No current facility-administered medications for this visit.        Physical Exam :BP 140/90   Pulse 87   Resp 20  Ht 5\' 6"  (1.676 m)   Wt 186 lb (84.4 kg)   SpO2 96% Comment: RA  BMI 30.02 kg/m  General appearance: alert and cooperative Head: Normocephalic, without obvious abnormality, atraumatic Neck: no adenopathy, no carotid bruit, no JVD, supple, symmetrical, trachea midline and thyroid not enlarged, symmetric, no tenderness/mass/nodules Lymph nodes: Cervical, supraclavicular, and axillary nodes normal. Resp: clear to auscultation bilaterally Back: symmetric, no curvature. ROM normal. No CVA tenderness. Cardio: regular rate and rhythm, S1, S2 normal, no murmur, click, rub or gallop GI: soft, non-tender; bowel sounds normal; no masses,  no organomegaly Extremities: extremities normal, atraumatic, no cyanosis or edema and Homans sign is negative, no sign of DVT Neurologic: Grossly normal    Diagnostic Studies &  Laboratory data:     Recent Radiology Findings:  Ct Chest Wo Contrast  Result Date: 09/08/2018 CLINICAL DATA:  Non-small-cell lung cancer. EXAM: CT CHEST WITHOUT CONTRAST TECHNIQUE: Multidetector CT imaging of the chest was performed following the standard protocol without IV contrast. COMPARISON:  09/02/2017 FINDINGS: Cardiovascular: The heart size is normal. No substantial pericardial effusion. Coronary artery calcification is evident. Atherosclerotic calcification is noted in the wall of the thoracic aorta. Mediastinum/Nodes: No mediastinal lymphadenopathy. No evidence for gross hilar lymphadenopathy although assessment is limited by the lack of intravenous contrast on today's study. The esophagus has normal imaging features. There is no axillary lymphadenopathy. Lungs/Pleura: Subpleural reticulation with bronchiectasis noted in the lungs bilaterally, as before. Staple line and scarring in the left upper lung is stable. No new or progressive findings. Upper Abdomen: Unremarkable Musculoskeletal: No worrisome lytic or sclerotic osseous abnormality. Mild compression deformity midthoracic vertebral body is stable. IMPRESSION: 1. Stable exam. Postoperative changes in the left lung. No new or progressive interval findings. 2. Bronchiectasis with subpleural reticulation in the lungs bilaterally. Underlying fibrotic lung disease not excluded. 3.  Aortic Atherosclerois (ICD10-170.0) Electronically Signed   By: Misty Stanley M.D.   On: 09/08/2018 09:44   I have independently reviewed the above radiology studies  and reviewed the findings with the patient.     Ct Chest Wo Contrast  Result Date: 09/02/2017 CLINICAL DATA:  Followup left upper lobe lung carcinoma. Followup left upper lobe opacity adjacent suture line. Status post thyroidectomy for thyroid carcinoma. EXAM: CT CHEST WITHOUT CONTRAST TECHNIQUE: Multidetector CT imaging of the chest was performed following the standard protocol without IV  contrast. COMPARISON:  03/04/2017 FINDINGS: Cardiovascular: No acute findings. Aortic and coronary artery atherosclerosis. Mediastinum/Nodes: No masses or pathologically enlarged lymph nodes identified on this unenhanced exam. Lungs/Pleura: Stable left upper lobe scarring from previous wedge resection. No associated mass identified. No evidence acute infiltrate or pleural effusion. Mild emphysema and bilateral lower lobe bronchiectasis again noted. Upper Abdomen: Normal adrenal glands. Stable small hemorrhagic and simple cysts in upper pole of right kidney. Small hiatal hernia again noted. Musculoskeletal:  No suspicious bone lesions. IMPRESSION: Stable left upper lobe postoperative changes from previous wedge resection. No evidence of recurrent or metastatic carcinoma within the thorax. Stable mild bilateral lower lobe bronchiectasis. Stable small hiatal hernia. Aortic Atherosclerosis (ICD10-I70.0) and Emphysema (ICD10-J43.9). Electronically Signed   By: Earle Gell M.D.   On: 09/02/2017 11:30   CLINICAL DATA:  Followup lung nodules.  EXAM: CT CHEST WITHOUT CONTRAST  TECHNIQUE: Multidetector CT imaging of the chest was performed following the standard protocol without IV contrast.  COMPARISON:  08/27/2016.  FINDINGS: Cardiovascular: There is moderate cardiac enlargement. Aortic atherosclerosis identified. Calcification in the LAD and left circumflex coronary artery is noted.  No pericardial effusion.  Mediastinum/Nodes: The trachea appears patent and is midline. Normal appearance of the esophagus. No enlarged mediastinal or hilar lymph nodes.  Lungs/Pleura: Mild changes of centrilobular emphysema. Postsurgical change in the left upper lobe is again identified. Index measurement within this area is 1.3 cm transverse, image 22 of series 4. Previously this measured the same. No new or enlarging pulmonary nodules or masses.  Upper Abdomen: Near the dome of liver there is an  indeterminate intermediate attenuating structure measuring 1.8 cm, image 95 of series 3. On study from 02/21/2015 this measured 1 cm. On CT from 02/13/2016 this measured 1.1 cm. Similar appearance of complex hyperdense cyst arising from the upper pole of the right kidney which is incompletely characterized without IV contrast.  Musculoskeletal: No aggressive lytic or sclerotic bone lesions identified. Stable mild chronic compression deformity within the mid thoracic spine, image 17 of series 602.  IMPRESSION: 1. Stable appearance of the chest. Postsurgical change within the left upper lobe with focal nodular thickening along the suture line is unchanged. No new or progressive findings in the chest. 2. There is a subtle, intermediate attenuating structure new the dome of liver which in retrospect has gradually increased in size from 02/21/2015. This is incompletely characterized, but is favored to represent a slow-growing indolent process. More definitive assessment of this structure in this patient who is at increased risk is advised with contrast enhanced liver protocol MRI or CT.   Electronically Signed   By: Kerby Moors M.D.   On: 03/04/2017 10:52  Ct Chest Wo Contrast  Result Date: 08/27/2016 CLINICAL DATA:  Followup lung cancer EXAM: CT CHEST WITHOUT CONTRAST TECHNIQUE: Multidetector CT imaging of the chest was performed following the standard protocol without IV contrast. COMPARISON:  02/13/2016 FINDINGS: Cardiovascular: Normal heart size. No pericardial effusion identified. Aortic atherosclerosis noted. Calcification within the LAD and left circumflex coronary artery noted. Mediastinum/Nodes: The trachea appears patent and is midline. Normal appearance of the esophagus. No mediastinal or hilar adenopathy. Small hiatal hernia noted. Lungs/Pleura: No pleural fluid identified. Postoperative changes from left upper lobe lung resection identified. Index measurement from focal  nodular thickening along the suture line measures 1.3 cm, image 26 of series 4. Previously 1.6 cm. There is noted new suspicious pulmonary nodule or mass identified. Upper Abdomen: Hyperdense lesion arising from the upper pole of the right kidney measures 9 mm and is unchanged from previous exam, image 126 of series 3. No acute findings identified within the upper abdomen. Musculoskeletal: No aggressive lytic or sclerotic bone lesions identified. IMPRESSION: 1. No acute findings. 2. Stable nodular thickening in the left upper lobe at resection site. No specific findings to suggest local tumor recurrence or metastatic disease. 3. Aortic atherosclerosis and coronary artery calcifications Electronically Signed   By: Kerby Moors M.D.   On: 08/27/2016 09:39   Ct Chest Wo Contrast  02/13/2016  CLINICAL DATA:  LEFT upper lobe surgery for lung cancer in September 2015. Subsequent treatment evaluation. EXAM: CT CHEST WITHOUT CONTRAST TECHNIQUE: Multidetector CT imaging of the chest was performed following the standard protocol without IV contrast. COMPARISON:  CT 08/22/2015 FINDINGS: Mediastinum/Nodes: No axillary supraclavicular adenopathy. No mediastinal hilar adenopathy. No pericardial fluid. Coronary artery calcifications present. Esophagus normal. Lungs/Pleura: There is linear nodular thickening in the LEFT upper lobe along the surgical resection site which is not changed compared to prior. Nodular component measures approximately 16 mm (image 11, series 4) compared to 15 mm on prior. No new nodularity. Mild ground-glass diffusely  within the LEFT lower lobe is unchanged. RIGHT lung is clear. Upper abdomen: Limited view the upper abdomen demonstrates normal adrenal glands. Stable high-density round lesion in the upper pole of the RIGHT kidney. Adjacent simple fluid density cyst appears benign. Atherosclerotic calcification aorta Musculoskeletal: Degenerate changes the spine. IMPRESSION: 1. Stable linear nodular  thickening in the LEFT upper lobe at resection site. No evidence of local recurrence. 2. No adenopathy. 3. Coronary artery calcifications Electronically Signed   By: Suzy Bouchard M.D.   On: 02/13/2016 10:00    Ct Chest Wo Contrast  02/21/2015   CLINICAL DATA:  Left upper lobe cancer with lobectomy in December 2014. Prior thyroid cancer in 2013 with thyroidectomy. Productive cough.  EXAM: CT CHEST WITHOUT CONTRAST  TECHNIQUE: Multidetector CT imaging of the chest was performed following the standard protocol without IV contrast.  COMPARISON:  Multiple exams, including 06/21/2014  FINDINGS: Mediastinum/Nodes: At the thoracic inlet a lymph node adjacent to the left carotid artery measures 7 mm in short axis, image 6 series 3, upper normal size. No pathologic thoracic adenopathy identified. Left anterior descending coronary artery stent noted. Aortic and branch vessel atherosclerotic calcification.  Lungs/Pleura: Left upper lobe wedge resections observed. There is slight lobularity along the upper margin of the wedge resection on image 8 of series 4 which could simply be due to scarring but which merits observation. No specific indicators of recurrent malignancy.  Patchy interstitial accentuation and hazy opacity peripherally in portions of both lungs but favoring the lower lobes, with underlying bronchiectasis. The possibility of early idiopathic pulmonary fibrosis is not excluded.  Upper abdomen: Small hypodense lesion in segment 4 of the liver, no change from 06/21/2014 and not previously hypermetabolic, likely a cyst, hemangioma, or similar benign lesion.  Musculoskeletal: Mild T7 anterior wedging appears chronic.  IMPRESSION: 1. Feet scarring along the wedge resection site likely merits periodic observation, but no recurrent malignancy is identified. 2. Hazy ground-glass opacity in the lung bases with faint interstitial accentuation and and peripheral bronchiectasis -early idiopathic pulmonary fibrosis is  not excluded. No overt honeycombing. 3. Atherosclerosis.   Electronically Signed   By: Van Clines M.D.   On: 02/21/2015 14:07     Recent Lab Findings: Lab Results  Component Value Date   WBC 5.8 08/10/2014   HGB 11.9 (L) 08/10/2014   HCT 35.9 (L) 08/10/2014   PLT 229 08/10/2014   GLUCOSE 93 08/10/2014   ALT 10 08/10/2014   AST 19 08/10/2014   NA 141 08/10/2014   K 4.1 08/10/2014   CL 102 08/10/2014   CREATININE 0.74 08/10/2014   BUN 13 08/10/2014   CO2 26 08/10/2014   INR 0.97 08/06/2014   Diagnosis 1. Lung, wedge biopsy/resection, left upper lobe - DENSELY INFLAMED LUNG PARENCHYMA. - THERE IS NO EVIDENCE OF MALIGNANCY. - SEE COMMENT. 2. Lung, wedge biopsy/resection, additional margin left upper lobe - BENIGN MILDLY INFLAMED LUNG PARENCHYMA WITH HEMORRHAGE. - THERE IS NO EVIDENCE OF MALIGNANCY. 3. Lung, wedge biopsy/resection, left upper lobe second nodule - ADENOCARCINOMA WITH A PREDOMINANT LEPIDIC GROWTH PATTERN, WELL DIFFERENTIATED, SPANNING 2.2 CM. - THE SURGICAL RESECTION MARGINS ARE NEGATIVE FOR ADENOCARCINOMA. - SEE ONCOLOGY TABLE BELOW. 4. Lymph node, biopsy, 10 L node - THERE IS NO EVIDENCE OF CARCINOMA IN 1 OF 1 LYMPH NODE (0/1). 5. Lymph node, biopsy, 4 L node - THERE IS NO EVIDENCE OF CARCINOMA IN 1 OF 1 LYMPH NODE (0/1). Microscopic Comment 1. The histologic features may be seen in a resolving pneumonia/pneumonitis. Clinical  correlation is recommended. Nonetheless, malignant features are not identified. Dr. Claudette Laws has reviewed selected slides of part 1 and concurs that this is a benign lesion. 3. LUNG Specimen, including laterality: Left upper lobe Procedure: Wedge resection Specimen integrity (intact/disrupted): Previously incised Tumor site: Left upper lobe ("second nodule") Tumor focality: Unifocal Maximum tumor size (cm): 2.2 cm (gross measurement) Histologic type: Adenocarcinoma with a predominant lepidic ("in situ") growth  pattern Grade: Well differentiated. Margins: Negative for adenocarcinoma Distance to closest margin (cm): At least 0.5 cm Visceral pleura invasion: Not identified 1 of    Assessment / Plan: Reviewed with the patient follow-up CT of the chest without evidence of recurrent disease we will continue to monitor her She is not eligible for lung cancer screening program as she was a never smoker We will see her back in 1 year with a follow-up CT of the chest noncontrast  Grace Isaac MD      [address] [place] [address] Office [phone]   [place]

## 2019-08-02 ENCOUNTER — Other Ambulatory Visit: Payer: Self-pay | Admitting: Cardiothoracic Surgery

## 2019-08-02 DIAGNOSIS — C3490 Malignant neoplasm of unspecified part of unspecified bronchus or lung: Secondary | ICD-10-CM

## 2019-09-21 ENCOUNTER — Ambulatory Visit
Admission: RE | Admit: 2019-09-21 | Discharge: 2019-09-21 | Disposition: A | Discharge status: Home / Self Care | Payer: Medicare Other | Source: Ambulatory Visit | Attending: Cardiothoracic Surgery | Admitting: Cardiothoracic Surgery

## 2019-09-21 ENCOUNTER — Ambulatory Visit: Payer: Medicare Other | Admitting: Cardiothoracic Surgery

## 2019-09-21 ENCOUNTER — Other Ambulatory Visit: Payer: Self-pay

## 2019-09-21 ENCOUNTER — Encounter: Payer: Self-pay | Admitting: Cardiothoracic Surgery

## 2019-09-21 VITALS — BP 162/84 | HR 76 | Temp 97.7°F | Resp 16 | Ht 66.0 in | Wt 189.0 lb

## 2019-09-21 DIAGNOSIS — C3412 Malignant neoplasm of upper lobe, left bronchus or lung: Secondary | ICD-10-CM | POA: Diagnosis not present

## 2019-09-21 DIAGNOSIS — Z09 Encounter for follow-up examination after completed treatment for conditions other than malignant neoplasm: Secondary | ICD-10-CM

## 2019-09-21 DIAGNOSIS — C3490 Malignant neoplasm of unspecified part of unspecified bronchus or lung: Secondary | ICD-10-CM

## 2019-09-21 NOTE — Progress Notes (Signed)
[address]       [place] [address]             [phone]      Cece S Huezo Turley Medical Record #[id number] Date of Birth: 1936-10-05  Referring: Gardiner Rhyme, MD Primary Care: Alma Friendly, MD  Chief Complaint:   POST OP FOLLOW UP 08/08/2014  OPERATIVE REPORT  PREOPERATIVE DIAGNOSIS: Left upper lobe lung lesion, mildly  hypermetabolic.  POSTOPERATIVE DIAGNOSIS: Inflammatory nodules, left upper lung x2. No  evidence of malignancy.  PROCEDURES PERFORMED: Bronchoscopy, left video-assisted thoracoscopy,  wedge resection of left upper lobe x2 and lymph node biopsy, placement  of On-Q device.  SURGEON: Lanelle Bal, MD Initially had surgery frozen section reported inflammatory nodules, subsequent permanent sections revealed stage Ia carcinoma left upper lobe  History of Present Illness:     Lung cancer,  left upper lobe   Primary site: Lung (Left)   Staging method: AJCC 7th Edition   Pathologic: Stage IA (T1b, N0, cM0) signed by Grace Isaac, MD on 08/10/2014  6:54 AM   Summary: Stage IA (T1b, N0, cM0)  Patient returns today for follow-up after surgical resection of 2 lung nodules which were found to be stage IA, patient was originally operated on September 2015, patient has had no respiratory complaints.  She is a lifelong non-smoker and continues to be so.  Patient denies any current respiratory symptoms  Patient does note that her husband has now been diagnosed with lung cancer being treated with chemotherapy only in [place]  Past Medical History:  Diagnosis Date  . Arthritis   . CAD (coronary artery disease)    with stent placement. Pt denies CAD or stent placement; had coronary calcifications on CT-had no ischemia, NL LVEF on stress echo 12/28/13 (Dr. Otho Perl; [place])  . Cancer ([place])    thyroid  . H/O: hysterectomy   . Hyperlipidemia   . Hypertension   . Hypothyroidism   . Lesion of left lung 11/22/13   left upper  lobe  . Shortness of breath    at times     Social History   Tobacco Use  Smoking Status Never Smoker  Smokeless Tobacco Never Used    Social History   Substance and Sexual Activity  Alcohol Use No  . Alcohol/week: 0.0 standard drinks     Allergies  Allergen Reactions  . Ace Inhibitors     cough  . Chlorthalidone Cough  . Lipitor [Atorvastatin]     Muscle weakness  . Zocor [Simvastatin]     Muscle weakness     Current Outpatient Medications  Medication Sig Dispense Refill  . amLODipine (NORVASC) 5 MG tablet Take 5 mg by mouth daily.    Marland Kitchen aspirin EC 81 MG tablet Take 81 mg by mouth daily.    . calcium-vitamin D (OSCAL WITH D) 500-200 MG-UNIT per tablet Take 1 tablet by mouth daily with breakfast.    . colesevelam (WELCHOL) 625 MG tablet Take 1,250 mg by mouth 2 (two) times daily with a meal.     . furosemide (LASIX) 20 MG tablet Take 20 mg by mouth daily.    Marland Kitchen levothyroxine (SYNTHROID, LEVOTHROID) 150 MCG tablet Take 200 mcg by mouth daily before breakfast.     . Omega-3 Fatty Acids (FISH OIL) 1000 MG CAPS Take 1 capsule by mouth daily.    Marland Kitchen telmisartan-hydrochlorothiazide (MICARDIS HCT) 80-25 MG per tablet Take 1 tablet by mouth daily.    Marland Kitchen  TURMERIC PO Take by mouth daily.     No current facility-administered medications for this visit.        Physical Exam :BP (!) 162/84 (BP Location: Right Arm, Patient Position: Sitting, Cuff Size: Normal)   Pulse 76   Temp 97.7 F (36.5 C)   Resp 16   Ht 5\' 6"  (1.676 m)   Wt 189 lb (85.7 kg)   SpO2 95% Comment: RA  BMI 30.51 kg/m  General appearance: alert, cooperative and no distress Lymph nodes: Cervical, supraclavicular, and axillary nodes normal. Resp: clear to auscultation bilaterally Cardio: regular rate and rhythm, S1, S2 normal, no murmur, click, rub or gallop GI: soft, non-tender; bowel sounds normal; no masses,  no organomegaly Extremities: extremities normal, atraumatic, no cyanosis or edema and Homans  sign is negative, no sign of DVT Neurologic: Grossly normal  Diagnostic Studies & Laboratory data:     Recent Radiology Findings:  Ct Chest Wo Contrast  Result Date: 09/21/2019 CLINICAL DATA:  Lung cancer. Non-small cell. restaging. EXAM: CT CHEST WITHOUT CONTRAST TECHNIQUE: Multidetector CT imaging of the chest was performed following the standard protocol without IV contrast. COMPARISON:  09/08/2018 FINDINGS: Cardiovascular: Normal heart size. No pericardial effusion. Aortic atherosclerosis and 3 vessel coronary artery atherosclerotic calcifications. Mediastinum/Nodes: No enlarged mediastinal or axillary lymph nodes. Thyroid gland, trachea, and esophagus demonstrate no significant findings. Lungs/Pleura: Status post wedge resection scratch set status post left upper lobe wedge resection. The appearance is stable from the prior exam without findings to suggest local tumor recurrence. Bilateral lower lung zone predominant bronchiectasis and interstitial reticulation with mild ground-glass attenuation is again noted. No suspicious pulmonary nodule. Upper Abdomen: No acute abnormality. Exophytic mildly hyperdense lesion containing mural calcification is noted arising from posterior cortex of the upper pole of right kidney measuring 1.3 cm, image 168/2. Incompletely characterized without IV contrast. Musculoskeletal: No chest wall mass or suspicious bone lesions identified. Mild midthoracic spine compression deformity is stable. IMPRESSION: Stable appearance of the chest status post left upper lobe wedge resection. No findings to suggest local tumor recurrence or metastatic disease. Aortic Atherosclerosis (ICD10-I70.0). Electronically Signed   By: Kerby Moors M.D.   On: 09/21/2019 10:29   I have independently reviewed the above radiology studies  and reviewed the findings with the patient. CT shows no evidence of recurrent disease in the left chest, on previous scans and on the PET scan the patient has a  large cystic structure in the right kidney.   Ct Chest Wo Contrast  Result Date: 09/08/2018 CLINICAL DATA:  Non-small-cell lung cancer. EXAM: CT CHEST WITHOUT CONTRAST TECHNIQUE: Multidetector CT imaging of the chest was performed following the standard protocol without IV contrast. COMPARISON:  09/02/2017 FINDINGS: Cardiovascular: The heart size is normal. No substantial pericardial effusion. Coronary artery calcification is evident. Atherosclerotic calcification is noted in the wall of the thoracic aorta. Mediastinum/Nodes: No mediastinal lymphadenopathy. No evidence for gross hilar lymphadenopathy although assessment is limited by the lack of intravenous contrast on today's study. The esophagus has normal imaging features. There is no axillary lymphadenopathy. Lungs/Pleura: Subpleural reticulation with bronchiectasis noted in the lungs bilaterally, as before. Staple line and scarring in the left upper lung is stable. No new or progressive findings. Upper Abdomen: Unremarkable Musculoskeletal: No worrisome lytic or sclerotic osseous abnormality. Mild compression deformity midthoracic vertebral body is stable. IMPRESSION: 1. Stable exam. Postoperative changes in the left lung. No new or progressive interval findings. 2. Bronchiectasis with subpleural reticulation in the lungs bilaterally. Underlying fibrotic lung disease  not excluded. 3.  Aortic Atherosclerois (ICD10-170.0) Electronically Signed   By: Misty Stanley M.D.   On: 09/08/2018 09:44   I have independently reviewed the above radiology studies  and reviewed the findings with the patient.     Ct Chest Wo Contrast  Result Date: 09/02/2017 CLINICAL DATA:  Followup left upper lobe lung carcinoma. Followup left upper lobe opacity adjacent suture line. Status post thyroidectomy for thyroid carcinoma. EXAM: CT CHEST WITHOUT CONTRAST TECHNIQUE: Multidetector CT imaging of the chest was performed following the standard protocol without IV contrast.  COMPARISON:  03/04/2017 FINDINGS: Cardiovascular: No acute findings. Aortic and coronary artery atherosclerosis. Mediastinum/Nodes: No masses or pathologically enlarged lymph nodes identified on this unenhanced exam. Lungs/Pleura: Stable left upper lobe scarring from previous wedge resection. No associated mass identified. No evidence acute infiltrate or pleural effusion. Mild emphysema and bilateral lower lobe bronchiectasis again noted. Upper Abdomen: Normal adrenal glands. Stable small hemorrhagic and simple cysts in upper pole of right kidney. Small hiatal hernia again noted. Musculoskeletal:  No suspicious bone lesions. IMPRESSION: Stable left upper lobe postoperative changes from previous wedge resection. No evidence of recurrent or metastatic carcinoma within the thorax. Stable mild bilateral lower lobe bronchiectasis. Stable small hiatal hernia. Aortic Atherosclerosis (ICD10-I70.0) and Emphysema (ICD10-J43.9). Electronically Signed   By: Earle Gell M.D.   On: 09/02/2017 11:30   CLINICAL DATA:  Followup lung nodules.  EXAM: CT CHEST WITHOUT CONTRAST  TECHNIQUE: Multidetector CT imaging of the chest was performed following the standard protocol without IV contrast.  COMPARISON:  08/27/2016.  FINDINGS: Cardiovascular: There is moderate cardiac enlargement. Aortic atherosclerosis identified. Calcification in the LAD and left circumflex coronary artery is noted. No pericardial effusion.  Mediastinum/Nodes: The trachea appears patent and is midline. Normal appearance of the esophagus. No enlarged mediastinal or hilar lymph nodes.  Lungs/Pleura: Mild changes of centrilobular emphysema. Postsurgical change in the left upper lobe is again identified. Index measurement within this area is 1.3 cm transverse, image 22 of series 4. Previously this measured the same. No new or enlarging pulmonary nodules or masses.  Upper Abdomen: Near the dome of liver there is an  indeterminate intermediate attenuating structure measuring 1.8 cm, image 95 of series 3. On study from 02/21/2015 this measured 1 cm. On CT from 02/13/2016 this measured 1.1 cm. Similar appearance of complex hyperdense cyst arising from the upper pole of the right kidney which is incompletely characterized without IV contrast.  Musculoskeletal: No aggressive lytic or sclerotic bone lesions identified. Stable mild chronic compression deformity within the mid thoracic spine, image 17 of series 602.  IMPRESSION: 1. Stable appearance of the chest. Postsurgical change within the left upper lobe with focal nodular thickening along the suture line is unchanged. No new or progressive findings in the chest. 2. There is a subtle, intermediate attenuating structure new the dome of liver which in retrospect has gradually increased in size from 02/21/2015. This is incompletely characterized, but is favored to represent a slow-growing indolent process. More definitive assessment of this structure in this patient who is at increased risk is advised with contrast enhanced liver protocol MRI or CT.   Electronically Signed   By: Kerby Moors M.D.   On: 03/04/2017 10:52  Ct Chest Wo Contrast  Result Date: 08/27/2016 CLINICAL DATA:  Followup lung cancer EXAM: CT CHEST WITHOUT CONTRAST TECHNIQUE: Multidetector CT imaging of the chest was performed following the standard protocol without IV contrast. COMPARISON:  02/13/2016 FINDINGS: Cardiovascular: Normal heart size. No pericardial effusion identified.  Aortic atherosclerosis noted. Calcification within the LAD and left circumflex coronary artery noted. Mediastinum/Nodes: The trachea appears patent and is midline. Normal appearance of the esophagus. No mediastinal or hilar adenopathy. Small hiatal hernia noted. Lungs/Pleura: No pleural fluid identified. Postoperative changes from left upper lobe lung resection identified. Index measurement from focal  nodular thickening along the suture line measures 1.3 cm, image 26 of series 4. Previously 1.6 cm. There is noted new suspicious pulmonary nodule or mass identified. Upper Abdomen: Hyperdense lesion arising from the upper pole of the right kidney measures 9 mm and is unchanged from previous exam, image 126 of series 3. No acute findings identified within the upper abdomen. Musculoskeletal: No aggressive lytic or sclerotic bone lesions identified. IMPRESSION: 1. No acute findings. 2. Stable nodular thickening in the left upper lobe at resection site. No specific findings to suggest local tumor recurrence or metastatic disease. 3. Aortic atherosclerosis and coronary artery calcifications Electronically Signed   By: Kerby Moors M.D.   On: 08/27/2016 09:39   Ct Chest Wo Contrast  02/13/2016  CLINICAL DATA:  LEFT upper lobe surgery for lung cancer in September 2015. Subsequent treatment evaluation. EXAM: CT CHEST WITHOUT CONTRAST TECHNIQUE: Multidetector CT imaging of the chest was performed following the standard protocol without IV contrast. COMPARISON:  CT 08/22/2015 FINDINGS: Mediastinum/Nodes: No axillary supraclavicular adenopathy. No mediastinal hilar adenopathy. No pericardial fluid. Coronary artery calcifications present. Esophagus normal. Lungs/Pleura: There is linear nodular thickening in the LEFT upper lobe along the surgical resection site which is not changed compared to prior. Nodular component measures approximately 16 mm (image 11, series 4) compared to 15 mm on prior. No new nodularity. Mild ground-glass diffusely within the LEFT lower lobe is unchanged. RIGHT lung is clear. Upper abdomen: Limited view the upper abdomen demonstrates normal adrenal glands. Stable high-density round lesion in the upper pole of the RIGHT kidney. Adjacent simple fluid density cyst appears benign. Atherosclerotic calcification aorta Musculoskeletal: Degenerate changes the spine. IMPRESSION: 1. Stable linear nodular  thickening in the LEFT upper lobe at resection site. No evidence of local recurrence. 2. No adenopathy. 3. Coronary artery calcifications Electronically Signed   By: Suzy Bouchard M.D.   On: 02/13/2016 10:00    Ct Chest Wo Contrast  02/21/2015   CLINICAL DATA:  Left upper lobe cancer with lobectomy in December 2014. Prior thyroid cancer in 2013 with thyroidectomy. Productive cough.  EXAM: CT CHEST WITHOUT CONTRAST  TECHNIQUE: Multidetector CT imaging of the chest was performed following the standard protocol without IV contrast.  COMPARISON:  Multiple exams, including 06/21/2014  FINDINGS: Mediastinum/Nodes: At the thoracic inlet a lymph node adjacent to the left carotid artery measures 7 mm in short axis, image 6 series 3, upper normal size. No pathologic thoracic adenopathy identified. Left anterior descending coronary artery stent noted. Aortic and branch vessel atherosclerotic calcification.  Lungs/Pleura: Left upper lobe wedge resections observed. There is slight lobularity along the upper margin of the wedge resection on image 8 of series 4 which could simply be due to scarring but which merits observation. No specific indicators of recurrent malignancy.  Patchy interstitial accentuation and hazy opacity peripherally in portions of both lungs but favoring the lower lobes, with underlying bronchiectasis. The possibility of early idiopathic pulmonary fibrosis is not excluded.  Upper abdomen: Small hypodense lesion in segment 4 of the liver, no change from 06/21/2014 and not previously hypermetabolic, likely a cyst, hemangioma, or similar benign lesion.  Musculoskeletal: Mild T7 anterior wedging appears chronic.  IMPRESSION: 1.  Feet scarring along the wedge resection site likely merits periodic observation, but no recurrent malignancy is identified. 2. Hazy ground-glass opacity in the lung bases with faint interstitial accentuation and and peripheral bronchiectasis -early idiopathic pulmonary fibrosis is  not excluded. No overt honeycombing. 3. Atherosclerosis.   Electronically Signed   By: Van Clines M.D.   On: 02/21/2015 14:07     Recent Lab Findings: Lab Results  Component Value Date   WBC 5.8 08/10/2014   HGB 11.9 (L) 08/10/2014   HCT 35.9 (L) 08/10/2014   PLT 229 08/10/2014   GLUCOSE 93 08/10/2014   ALT 10 08/10/2014   AST 19 08/10/2014   NA 141 08/10/2014   K 4.1 08/10/2014   CL 102 08/10/2014   CREATININE 0.74 08/10/2014   BUN 13 08/10/2014   CO2 26 08/10/2014   INR 0.97 08/06/2014   Diagnosis 1. Lung, wedge biopsy/resection, left upper lobe - DENSELY INFLAMED LUNG PARENCHYMA. - THERE IS NO EVIDENCE OF MALIGNANCY. - SEE COMMENT. 2. Lung, wedge biopsy/resection, additional margin left upper lobe - BENIGN MILDLY INFLAMED LUNG PARENCHYMA WITH HEMORRHAGE. - THERE IS NO EVIDENCE OF MALIGNANCY. 3. Lung, wedge biopsy/resection, left upper lobe second nodule - ADENOCARCINOMA WITH A PREDOMINANT LEPIDIC GROWTH PATTERN, WELL DIFFERENTIATED, SPANNING 2.2 CM. - THE SURGICAL RESECTION MARGINS ARE NEGATIVE FOR ADENOCARCINOMA. - SEE ONCOLOGY TABLE BELOW. 4. Lymph node, biopsy, 10 L node - THERE IS NO EVIDENCE OF CARCINOMA IN 1 OF 1 LYMPH NODE (0/1). 5. Lymph node, biopsy, 4 L node - THERE IS NO EVIDENCE OF CARCINOMA IN 1 OF 1 LYMPH NODE (0/1). Microscopic Comment 1. The histologic features may be seen in a resolving pneumonia/pneumonitis. Clinical correlation is recommended. Nonetheless, malignant features are not identified. Dr. Claudette Laws has reviewed selected slides of part 1 and concurs that this is a benign lesion. 3. LUNG Specimen, including laterality: Left upper lobe Procedure: Wedge resection Specimen integrity (intact/disrupted): Previously incised Tumor site: Left upper lobe ("second nodule") Tumor focality: Unifocal Maximum tumor size (cm): 2.2 cm (gross measurement) Histologic type: Adenocarcinoma with a predominant lepidic ("in situ") growth  pattern Grade: Well differentiated. Margins: Negative for adenocarcinoma Distance to closest margin (cm): At least 0.5 cm Visceral pleura invasion: Not identified 1 of    Assessment / Plan:  Patient now 5 years after resection of adenocarcinoma without recurrence, the patient is a lifelong non-smoker so cannot be referred to the lung cancer screening program.   We will plan to see her back in 1 year with a follow-up CT of the chest    Grace Isaac MD      [address] ,[place] [address] Office [phone]   [place]

## 2020-08-13 ENCOUNTER — Other Ambulatory Visit: Payer: Self-pay | Admitting: *Deleted

## 2020-08-13 DIAGNOSIS — C3412 Malignant neoplasm of upper lobe, left bronchus or lung: Secondary | ICD-10-CM

## 2020-08-13 NOTE — Progress Notes (Unsigned)
Ct chest

## 2020-09-10 ENCOUNTER — Other Ambulatory Visit: Payer: Self-pay | Admitting: Cardiothoracic Surgery

## 2020-09-12 ENCOUNTER — Ambulatory Visit: Payer: Medicare PPO | Admitting: Cardiothoracic Surgery

## 2020-09-12 ENCOUNTER — Ambulatory Visit
Admission: RE | Admit: 2020-09-12 | Discharge: 2020-09-12 | Disposition: A | Discharge status: Home / Self Care | Payer: Medicare PPO | Source: Ambulatory Visit | Attending: Cardiothoracic Surgery | Admitting: Cardiothoracic Surgery

## 2020-09-12 ENCOUNTER — Other Ambulatory Visit: Payer: Self-pay

## 2020-09-12 ENCOUNTER — Encounter: Payer: Self-pay | Admitting: Cardiothoracic Surgery

## 2020-09-12 VITALS — BP 135/80 | HR 95 | Resp 18 | Ht 66.0 in | Wt 186.6 lb

## 2020-09-12 DIAGNOSIS — C3412 Malignant neoplasm of upper lobe, left bronchus or lung: Secondary | ICD-10-CM

## 2020-09-12 NOTE — Progress Notes (Signed)
[address]       [place] [address]             [phone]      Breshae S Clover [facility] Medical Record #[id number] Date of Birth: [birth year]  Referring: Gardiner Rhyme, MD Primary Care: Alma Friendly, MD  Chief Complaint:   POST OP FOLLOW UP 08/08/2014  OPERATIVE REPORT  PREOPERATIVE DIAGNOSIS: Left upper lobe lung lesion, mildly  hypermetabolic.  POSTOPERATIVE DIAGNOSIS: Inflammatory nodules, left upper lung x2. No  evidence of malignancy.  PROCEDURES PERFORMED: Bronchoscopy, left video-assisted thoracoscopy,  wedge resection of left upper lobe x2 and lymph node biopsy, placement  of On-Q device.  SURGEON: Lanelle Bal, MD Initially had surgery frozen section reported inflammatory nodules, subsequent permanent sections revealed stage Ia carcinoma left upper lobe  History of Present Illness:     Lung cancer,  left upper lobe   Primary site: Lung (Left)   Staging method: AJCC 7th Edition   Pathologic: Stage IA (T1b, N0, cM0) signed by Grace Isaac, MD on 08/10/2014  6:54 AM   Summary: Stage IA (T1b, N0, cM0)  Patient returns today for follow-up after surgical resection of 2 lung nodules which were found to be stage IA, patient was originally operated on September 2015, patient has had no respiratory complaints.  She is a lifelong non-smoker and continues to be so.  Patient denies any current respiratory symptoms,   Patient notes she has been fully vaccinated , currently waiting for a booster   Past Medical History:  Diagnosis Date  . Arthritis   . CAD (coronary artery disease)    with stent placement. Pt denies CAD or stent placement; had coronary calcifications on CT-had no ischemia, NL LVEF on stress echo 12/28/13 (Dr. Otho Perl; [place])  . Cancer ([facility])    thyroid  . H/O: hysterectomy   . Hyperlipidemia   . Hypertension   . Hypothyroidism   . Lesion of left lung 11/22/13   left upper lobe  . Shortness of breath    at times      Social History   Tobacco Use  Smoking Status Never Smoker  Smokeless Tobacco Never Used    Social History   Substance and Sexual Activity  Alcohol Use No  . Alcohol/week: 0.0 standard drinks     Allergies  Allergen Reactions  . Ace Inhibitors     cough  . Chlorthalidone Cough  . Lipitor [Atorvastatin]     Muscle weakness  . Zocor [Simvastatin]     Muscle weakness     Current Outpatient Medications  Medication Sig Dispense Refill  . amLODipine (NORVASC) 5 MG tablet Take 5 mg by mouth daily.    Marland Kitchen aspirin EC 81 MG tablet Take 81 mg by mouth daily.    . calcium-vitamin D (OSCAL WITH D) 500-200 MG-UNIT per tablet Take 1 tablet by mouth daily with breakfast.    . colesevelam (WELCHOL) 625 MG tablet Take 1,250 mg by mouth 2 (two) times daily with a meal.     . furosemide (LASIX) 20 MG tablet Take 20 mg by mouth daily.    Marland Kitchen levothyroxine (SYNTHROID, LEVOTHROID) 150 MCG tablet Take 200 mcg by mouth daily before breakfast.     . Omega-3 Fatty Acids (FISH OIL) 1000 MG CAPS Take 1 capsule by mouth daily.    Marland Kitchen telmisartan-hydrochlorothiazide (MICARDIS HCT) 80-25 MG per tablet Take 1 tablet by mouth daily.    . TURMERIC PO Take by mouth  daily.     No current facility-administered medications for this visit.       Physical Exam :BP 135/80 (BP Location: Left Arm, Patient Position: Sitting)   Pulse 95   Resp 18   Ht 5\' 6"  (1.676 m)   Wt 186 lb 9.6 oz (84.6 kg)   SpO2 95% Comment: RA with mask on  BMI 30.12 kg/m  General appearance: alert, cooperative and no distress Neck: no adenopathy, no carotid bruit, no JVD, supple, symmetrical, trachea midline and thyroid not enlarged, symmetric, no tenderness/mass/nodules Lymph nodes: Cervical, supraclavicular, and axillary nodes normal. Resp: clear to auscultation bilaterally Cardio: regular rate and rhythm, S1, S2 normal, no murmur, click, rub or gallop GI: soft, non-tender; bowel sounds normal; no masses,  no  organomegaly Extremities: extremities normal, atraumatic, no cyanosis or edema Neurologic: Grossly normal  Diagnostic Studies & Laboratory data:     Recent Radiology Findings:  CT Chest Wo Contrast  Result Date: 09/12/2020 CLINICAL DATA:  LEFT upper lobe webs resection for lung cancer. Routine surveillance. EXAM: CT CHEST WITHOUT CONTRAST TECHNIQUE: Multidetector CT imaging of the chest was performed following the standard protocol without IV contrast. COMPARISON:  None. FINDINGS: Cardiovascular: Coronary artery calcification and aortic atherosclerotic calcification. Mediastinum/Nodes: No axillary or supraclavicular adenopathy. No mediastinal or hilar adenopathy. No pericardial fluid. Esophagus normal. Lungs/Pleura: Stable thickening along the resection margin in the LEFT upper lobe. No new nodularity. Subtle nonfocal ground-glass density in the superior segment of the LEFT lower lobe (image 98/series 8, for example) is new from prior. Subpleural cystic change in the more inferior LEFT lower lobe is stable. RIGHT lung clear Upper Abdomen: Limited view of the liver, kidneys, pancreas are unremarkable. Normal adrenal glands. Musculoskeletal: No chest wall mass or suspicious bone lesions identified. IMPRESSION: 1. No evidence of lung cancer recurrence in LEFT upper lobe following lung wedge resection. 2. No lymphadenopathy. 3. New nonfocal ground-glass densities in the LEFT lower lobe most consistent with mild pulmonary infection or asymmetric edema. 4. Stable subpleural cystic change at the lung bases. 5. Coronary artery calcification and Aortic Atherosclerosis (ICD10-I70.0). Electronically Signed   By: Suzy Bouchard M.D.   On: 09/12/2020 11:20   I have independently reviewed the above radiology studies  and reviewed the findings with the patient.    Ct Chest Wo Contrast  Result Date: 09/08/2018 CLINICAL DATA:  Non-small-cell lung cancer. EXAM: CT CHEST WITHOUT CONTRAST TECHNIQUE: Multidetector  CT imaging of the chest was performed following the standard protocol without IV contrast. COMPARISON:  09/02/2017 FINDINGS: Cardiovascular: The heart size is normal. No substantial pericardial effusion. Coronary artery calcification is evident. Atherosclerotic calcification is noted in the wall of the thoracic aorta. Mediastinum/Nodes: No mediastinal lymphadenopathy. No evidence for gross hilar lymphadenopathy although assessment is limited by the lack of intravenous contrast on today's study. The esophagus has normal imaging features. There is no axillary lymphadenopathy. Lungs/Pleura: Subpleural reticulation with bronchiectasis noted in the lungs bilaterally, as before. Staple line and scarring in the left upper lung is stable. No new or progressive findings. Upper Abdomen: Unremarkable Musculoskeletal: No worrisome lytic or sclerotic osseous abnormality. Mild compression deformity midthoracic vertebral body is stable. IMPRESSION: 1. Stable exam. Postoperative changes in the left lung. No new or progressive interval findings. 2. Bronchiectasis with subpleural reticulation in the lungs bilaterally. Underlying fibrotic lung disease not excluded. 3.  Aortic Atherosclerois (ICD10-170.0) Electronically Signed   By: Misty Stanley M.D.   On: 09/08/2018 09:44     Ct Chest Wo Contrast  Result  Date: 09/02/2017 CLINICAL DATA:  Followup left upper lobe lung carcinoma. Followup left upper lobe opacity adjacent suture line. Status post thyroidectomy for thyroid carcinoma. EXAM: CT CHEST WITHOUT CONTRAST TECHNIQUE: Multidetector CT imaging of the chest was performed following the standard protocol without IV contrast. COMPARISON:  03/04/2017 FINDINGS: Cardiovascular: No acute findings. Aortic and coronary artery atherosclerosis. Mediastinum/Nodes: No masses or pathologically enlarged lymph nodes identified on this unenhanced exam. Lungs/Pleura: Stable left upper lobe scarring from previous wedge resection. No associated  mass identified. No evidence acute infiltrate or pleural effusion. Mild emphysema and bilateral lower lobe bronchiectasis again noted. Upper Abdomen: Normal adrenal glands. Stable small hemorrhagic and simple cysts in upper pole of right kidney. Small hiatal hernia again noted. Musculoskeletal:  No suspicious bone lesions. IMPRESSION: Stable left upper lobe postoperative changes from previous wedge resection. No evidence of recurrent or metastatic carcinoma within the thorax. Stable mild bilateral lower lobe bronchiectasis. Stable small hiatal hernia. Aortic Atherosclerosis (ICD10-I70.0) and Emphysema (ICD10-J43.9). Electronically Signed   By: Earle Gell M.D.   On: 09/02/2017 11:30   CLINICAL DATA:  Followup lung nodules.  EXAM: CT CHEST WITHOUT CONTRAST  TECHNIQUE: Multidetector CT imaging of the chest was performed following the standard protocol without IV contrast.  COMPARISON:  08/27/2016.  FINDINGS: Cardiovascular: There is moderate cardiac enlargement. Aortic atherosclerosis identified. Calcification in the LAD and left circumflex coronary artery is noted. No pericardial effusion.  Mediastinum/Nodes: The trachea appears patent and is midline. Normal appearance of the esophagus. No enlarged mediastinal or hilar lymph nodes.  Lungs/Pleura: Mild changes of centrilobular emphysema. Postsurgical change in the left upper lobe is again identified. Index measurement within this area is 1.3 cm transverse, image 22 of series 4. Previously this measured the same. No new or enlarging pulmonary nodules or masses.  Upper Abdomen: Near the dome of liver there is an indeterminate intermediate attenuating structure measuring 1.8 cm, image 95 of series 3. On study from 02/21/2015 this measured 1 cm. On CT from 02/13/2016 this measured 1.1 cm. Similar appearance of complex hyperdense cyst arising from the upper pole of the right kidney which is incompletely characterized without IV  contrast.  Musculoskeletal: No aggressive lytic or sclerotic bone lesions identified. Stable mild chronic compression deformity within the mid thoracic spine, image 17 of series 602.  IMPRESSION: 1. Stable appearance of the chest. Postsurgical change within the left upper lobe with focal nodular thickening along the suture line is unchanged. No new or progressive findings in the chest. 2. There is a subtle, intermediate attenuating structure new the dome of liver which in retrospect has gradually increased in size from 02/21/2015. This is incompletely characterized, but is favored to represent a slow-growing indolent process. More definitive assessment of this structure in this patient who is at increased risk is advised with contrast enhanced liver protocol MRI or CT.   Electronically Signed   By: Kerby Moors M.D.   On: 03/04/2017 10:52  Ct Chest Wo Contrast  Result Date: 08/27/2016 CLINICAL DATA:  Followup lung cancer EXAM: CT CHEST WITHOUT CONTRAST TECHNIQUE: Multidetector CT imaging of the chest was performed following the standard protocol without IV contrast. COMPARISON:  02/13/2016 FINDINGS: Cardiovascular: Normal heart size. No pericardial effusion identified. Aortic atherosclerosis noted. Calcification within the LAD and left circumflex coronary artery noted. Mediastinum/Nodes: The trachea appears patent and is midline. Normal appearance of the esophagus. No mediastinal or hilar adenopathy. Small hiatal hernia noted. Lungs/Pleura: No pleural fluid identified. Postoperative changes from left upper lobe lung resection identified.  Index measurement from focal nodular thickening along the suture line measures 1.3 cm, image 26 of series 4. Previously 1.6 cm. There is noted new suspicious pulmonary nodule or mass identified. Upper Abdomen: Hyperdense lesion arising from the upper pole of the right kidney measures 9 mm and is unchanged from previous exam, image 126 of series 3. No  acute findings identified within the upper abdomen. Musculoskeletal: No aggressive lytic or sclerotic bone lesions identified. IMPRESSION: 1. No acute findings. 2. Stable nodular thickening in the left upper lobe at resection site. No specific findings to suggest local tumor recurrence or metastatic disease. 3. Aortic atherosclerosis and coronary artery calcifications Electronically Signed   By: Kerby Moors M.D.   On: 08/27/2016 09:39   Ct Chest Wo Contrast  02/13/2016  CLINICAL DATA:  LEFT upper lobe surgery for lung cancer in September 2015. Subsequent treatment evaluation. EXAM: CT CHEST WITHOUT CONTRAST TECHNIQUE: Multidetector CT imaging of the chest was performed following the standard protocol without IV contrast. COMPARISON:  CT 08/22/2015 FINDINGS: Mediastinum/Nodes: No axillary supraclavicular adenopathy. No mediastinal hilar adenopathy. No pericardial fluid. Coronary artery calcifications present. Esophagus normal. Lungs/Pleura: There is linear nodular thickening in the LEFT upper lobe along the surgical resection site which is not changed compared to prior. Nodular component measures approximately 16 mm (image 11, series 4) compared to 15 mm on prior. No new nodularity. Mild ground-glass diffusely within the LEFT lower lobe is unchanged. RIGHT lung is clear. Upper abdomen: Limited view the upper abdomen demonstrates normal adrenal glands. Stable high-density round lesion in the upper pole of the RIGHT kidney. Adjacent simple fluid density cyst appears benign. Atherosclerotic calcification aorta Musculoskeletal: Degenerate changes the spine. IMPRESSION: 1. Stable linear nodular thickening in the LEFT upper lobe at resection site. No evidence of local recurrence. 2. No adenopathy. 3. Coronary artery calcifications Electronically Signed   By: Suzy Bouchard M.D.   On: 02/13/2016 10:00    Ct Chest Wo Contrast  02/21/2015   CLINICAL DATA:  Left upper lobe cancer with lobectomy in December 2014.  Prior thyroid cancer in 2013 with thyroidectomy. Productive cough.  EXAM: CT CHEST WITHOUT CONTRAST  TECHNIQUE: Multidetector CT imaging of the chest was performed following the standard protocol without IV contrast.  COMPARISON:  Multiple exams, including 06/21/2014  FINDINGS: Mediastinum/Nodes: At the thoracic inlet a lymph node adjacent to the left carotid artery measures 7 mm in short axis, image 6 series 3, upper normal size. No pathologic thoracic adenopathy identified. Left anterior descending coronary artery stent noted. Aortic and branch vessel atherosclerotic calcification.  Lungs/Pleura: Left upper lobe wedge resections observed. There is slight lobularity along the upper margin of the wedge resection on image 8 of series 4 which could simply be due to scarring but which merits observation. No specific indicators of recurrent malignancy.  Patchy interstitial accentuation and hazy opacity peripherally in portions of both lungs but favoring the lower lobes, with underlying bronchiectasis. The possibility of early idiopathic pulmonary fibrosis is not excluded.  Upper abdomen: Small hypodense lesion in segment 4 of the liver, no change from 06/21/2014 and not previously hypermetabolic, likely a cyst, hemangioma, or similar benign lesion.  Musculoskeletal: Mild T7 anterior wedging appears chronic.  IMPRESSION: 1. Feet scarring along the wedge resection site likely merits periodic observation, but no recurrent malignancy is identified. 2. Hazy ground-glass opacity in the lung bases with faint interstitial accentuation and and peripheral bronchiectasis -early idiopathic pulmonary fibrosis is not excluded. No overt honeycombing. 3. Atherosclerosis.   Electronically Signed  By: Van Clines M.D.   On: 02/21/2015 14:07     Recent Lab Findings: Lab Results  Component Value Date   WBC 5.8 08/10/2014   HGB 11.9 (L) 08/10/2014   HCT 35.9 (L) 08/10/2014   PLT 229 08/10/2014   GLUCOSE 93 08/10/2014    ALT 10 08/10/2014   AST 19 08/10/2014   NA 141 08/10/2014   K 4.1 08/10/2014   CL 102 08/10/2014   CREATININE 0.74 08/10/2014   BUN 13 08/10/2014   CO2 26 08/10/2014   INR 0.97 08/06/2014   Diagnosis 1. Lung, wedge biopsy/resection, left upper lobe - DENSELY INFLAMED LUNG PARENCHYMA. - THERE IS NO EVIDENCE OF MALIGNANCY. - SEE COMMENT. 2. Lung, wedge biopsy/resection, additional margin left upper lobe - BENIGN MILDLY INFLAMED LUNG PARENCHYMA WITH HEMORRHAGE. - THERE IS NO EVIDENCE OF MALIGNANCY. 3. Lung, wedge biopsy/resection, left upper lobe second nodule - ADENOCARCINOMA WITH A PREDOMINANT LEPIDIC GROWTH PATTERN, WELL DIFFERENTIATED, SPANNING 2.2 CM. - THE SURGICAL RESECTION MARGINS ARE NEGATIVE FOR ADENOCARCINOMA. - SEE ONCOLOGY TABLE BELOW. 4. Lymph node, biopsy, 10 L node - THERE IS NO EVIDENCE OF CARCINOMA IN 1 OF 1 LYMPH NODE (0/1). 5. Lymph node, biopsy, 4 L node - THERE IS NO EVIDENCE OF CARCINOMA IN 1 OF 1 LYMPH NODE (0/1). Microscopic Comment 1. The histologic features may be seen in a resolving pneumonia/pneumonitis. Clinical correlation is recommended. Nonetheless, malignant features are not identified. Dr. Claudette Laws has reviewed selected slides of part 1 and concurs that this is a benign lesion. 3. LUNG Specimen, including laterality: Left upper lobe Procedure: Wedge resection Specimen integrity (intact/disrupted): Previously incised Tumor site: Left upper lobe ("second nodule") Tumor focality: Unifocal Maximum tumor size (cm): 2.2 cm (gross measurement) Histologic type: Adenocarcinoma with a predominant lepidic ("in situ") growth pattern Grade: Well differentiated. Margins: Negative for adenocarcinoma Distance to closest margin (cm): At least 0.5 cm Visceral pleura invasion: Not identified 1 of    Assessment / Plan: Patient now 6 years after resection of left upper lobe adenocarcinoma-at the time of surgery the initial frozen section reported as  inflammatory, on permanent sections the second nodule was adenocarcinoma staged as a 1A.  Because the patient has been a lifelong non-smoker she is in eligible for lung cancer screening CTs.  We we will have her return in 1 year with a follow-up CT of the chest-specifically to look at vague groundglass areas left lower lobe  Grace Isaac MD      [address] [place] [address] Office [phone]   [place]

## 2020-09-19 ENCOUNTER — Ambulatory Visit: Payer: Medicare Other | Admitting: Cardiothoracic Surgery

## 2021-08-04 ENCOUNTER — Other Ambulatory Visit: Payer: Self-pay | Admitting: Thoracic Surgery (Cardiothoracic Vascular Surgery)

## 2021-08-04 DIAGNOSIS — C3412 Malignant neoplasm of upper lobe, left bronchus or lung: Secondary | ICD-10-CM

## 2021-09-18 ENCOUNTER — Other Ambulatory Visit: Payer: Medicare PPO

## 2021-09-18 ENCOUNTER — Ambulatory Visit: Payer: Medicare PPO | Admitting: Physician Assistant
